# Patient Record
Sex: Female | Born: 1935 | Race: White | Hispanic: No | Smoking: Never smoker
Health system: Southern US, Community
[De-identification: ages and names within clinical notes are randomized; demographics above are authoritative.]

## PROBLEM LIST (undated history)

## (undated) DIAGNOSIS — R609 Edema, unspecified: Secondary | ICD-10-CM

## (undated) DIAGNOSIS — R631 Polydipsia: Secondary | ICD-10-CM

## (undated) DIAGNOSIS — F323 Major depressive disorder, single episode, severe with psychotic features: Secondary | ICD-10-CM

## (undated) DIAGNOSIS — E70319 Ocular albinism, unspecified: Secondary | ICD-10-CM

## (undated) DIAGNOSIS — L719 Rosacea, unspecified: Secondary | ICD-10-CM

## (undated) DIAGNOSIS — I10 Essential (primary) hypertension: Secondary | ICD-10-CM

## (undated) DIAGNOSIS — F0390 Unspecified dementia without behavioral disturbance: Secondary | ICD-10-CM

## (undated) DIAGNOSIS — E785 Hyperlipidemia, unspecified: Secondary | ICD-10-CM

## (undated) DIAGNOSIS — F419 Anxiety disorder, unspecified: Secondary | ICD-10-CM

---

## 2003-12-12 ENCOUNTER — Other Ambulatory Visit: Payer: Self-pay

## 2005-03-06 ENCOUNTER — Emergency Department: Payer: Self-pay | Admitting: Internal Medicine

## 2006-06-16 ENCOUNTER — Ambulatory Visit: Payer: Self-pay | Admitting: Family Medicine

## 2006-07-26 ENCOUNTER — Ambulatory Visit: Payer: Self-pay | Admitting: Family Medicine

## 2007-07-10 ENCOUNTER — Ambulatory Visit: Payer: Self-pay | Admitting: Gastroenterology

## 2007-08-01 ENCOUNTER — Ambulatory Visit: Payer: Self-pay | Admitting: Family Medicine

## 2007-09-15 ENCOUNTER — Emergency Department: Payer: Self-pay | Admitting: Unknown Physician Specialty

## 2007-09-15 ENCOUNTER — Other Ambulatory Visit: Payer: Self-pay

## 2008-08-13 ENCOUNTER — Ambulatory Visit: Payer: Self-pay | Admitting: Family Medicine

## 2008-12-03 ENCOUNTER — Ambulatory Visit: Payer: Self-pay

## 2009-08-28 ENCOUNTER — Ambulatory Visit: Payer: Self-pay | Admitting: Family Medicine

## 2011-05-10 ENCOUNTER — Inpatient Hospital Stay: Payer: Self-pay | Admitting: Internal Medicine

## 2012-01-14 LAB — URINALYSIS, COMPLETE
Bilirubin,UR: NEGATIVE
Glucose,UR: NEGATIVE mg/dL (ref 0–75)
Hyaline Cast: 10
Ketone: NEGATIVE
Nitrite: NEGATIVE
Ph: 7 (ref 4.5–8.0)
RBC,UR: 14 /HPF (ref 0–5)
Squamous Epithelial: 1
WBC UR: 10 /HPF (ref 0–5)

## 2012-01-14 LAB — BASIC METABOLIC PANEL
Calcium, Total: 9.6 mg/dL (ref 8.5–10.1)
Chloride: 104 mmol/L (ref 98–107)
Co2: 27 mmol/L (ref 21–32)
Creatinine: 1.1 mg/dL (ref 0.60–1.30)
EGFR (Non-African Amer.): 51 — ABNORMAL LOW
Glucose: 142 mg/dL — ABNORMAL HIGH (ref 65–99)
Osmolality: 289 (ref 275–301)
Potassium: 3.5 mmol/L (ref 3.5–5.1)
Sodium: 144 mmol/L (ref 136–145)

## 2012-01-14 LAB — CBC
HCT: 48.1 % — ABNORMAL HIGH (ref 35.0–47.0)
HGB: 16.1 g/dL — ABNORMAL HIGH (ref 12.0–16.0)
MCH: 31.8 pg (ref 26.0–34.0)
MCHC: 33.5 g/dL (ref 32.0–36.0)
RDW: 13.5 % (ref 11.5–14.5)

## 2012-01-14 LAB — TROPONIN I: Troponin-I: 0.06 ng/mL — ABNORMAL HIGH

## 2012-01-15 LAB — TROPONIN I: Troponin-I: 0.14 ng/mL — ABNORMAL HIGH

## 2012-01-16 ENCOUNTER — Inpatient Hospital Stay: Payer: Self-pay | Admitting: Internal Medicine

## 2012-01-16 DIAGNOSIS — I498 Other specified cardiac arrhythmias: Secondary | ICD-10-CM

## 2012-01-16 LAB — URINE CULTURE

## 2012-01-18 LAB — MAGNESIUM: Magnesium: 1.9 mg/dL

## 2012-01-18 LAB — BASIC METABOLIC PANEL
Anion Gap: 11 (ref 7–16)
BUN: 4 mg/dL — ABNORMAL LOW (ref 7–18)
Calcium, Total: 8.7 mg/dL (ref 8.5–10.1)
Chloride: 109 mmol/L — ABNORMAL HIGH (ref 98–107)
Co2: 26 mmol/L (ref 21–32)
Creatinine: 0.74 mg/dL (ref 0.60–1.30)
EGFR (African American): >60
Osmolality: 288 (ref 275–301)

## 2012-01-18 LAB — CBC WITH DIFFERENTIAL/PLATELET
Basophil %: 0.5 %
Eosinophil #: 0.1 10*3/uL (ref 0.0–0.7)
Eosinophil %: 1.4 %
HCT: 39 % (ref 35.0–47.0)
MCH: 31.3 pg (ref 26.0–34.0)
MCHC: 33.3 g/dL (ref 32.0–36.0)
MCV: 94 fL (ref 80–100)
Monocyte #: 0.4 10*3/uL (ref 0.0–0.7)
Monocyte %: 7.4 %
Neutrophil #: 3.1 10*3/uL (ref 1.4–6.5)
Neutrophil %: 58 %
Platelet: 219 10*3/uL (ref 150–440)
RBC: 4.15 10*6/uL (ref 3.80–5.20)
RDW: 13.2 % (ref 11.5–14.5)
WBC: 5.4 10*3/uL (ref 3.6–11.0)

## 2012-01-19 LAB — BASIC METABOLIC PANEL
Anion Gap: 10 (ref 7–16)
BUN: 5 mg/dL — ABNORMAL LOW (ref 7–18)
Calcium, Total: 8.9 mg/dL (ref 8.5–10.1)
Chloride: 109 mmol/L — ABNORMAL HIGH (ref 98–107)
Co2: 28 mmol/L (ref 21–32)
Creatinine: 0.79 mg/dL (ref 0.60–1.30)
Sodium: 147 mmol/L — ABNORMAL HIGH (ref 136–145)

## 2014-10-09 ENCOUNTER — Emergency Department: Payer: Self-pay | Admitting: Emergency Medicine

## 2015-01-17 ENCOUNTER — Emergency Department: Payer: Self-pay | Admitting: Emergency Medicine

## 2015-04-08 ENCOUNTER — Ambulatory Visit
Admit: 2015-04-08 | Disposition: A | Discharge status: Home / Self Care | Payer: Self-pay | Attending: Ophthalmology | Admitting: Ophthalmology

## 2015-04-13 NOTE — Discharge Summary (Signed)
PATIENT NAME:  Jeanette MedinaHARRIS, Jeanette T MR#:  [id number] DATE OF BIRTH:  06-16-1929  DATE OF ADMISSION:  01/16/2012 DATE OF DISCHARGE:  01/19/2012  PRIMARY CARE PHYSICIAN: Dr. Hillery AldoSarah Patel REFERRING PHYSICIAN:  Dr. Shaune PollackLord ADMITTING PHYSICIAN: Dr. [name] DISCHARGING PHYSICIAN:  Dr. Lafayette DragonFirozvi   ADDENDUM TO DISCHARGE SUMMARY:  DISCHARGE DIAGNOSES:  1. Altered mental status secondary to urinary tract infection.  2. Elevated troponin not likely due to acute cardiac ischemia. Most likely demand ischemia. 3. Dementia.  4. Hypertension 5. Hyperlipidemia. 6. Hyperglycemia.  7. Low potassium.  8. Hypernatremia.  9. Altered mental status secondary to dementia with encephalopathy from hypernatremia.  HOSPITAL COURSE:  The patient had hypernatremia from dehydration from urinary tract infection, which probably worsened her mental status. She has baseline dementia that probably worsened, causing metabolic encephalopathy from her dehydration.  Thank you for allowing me to participate in the care of this patient.   CODE STATUS:  FULL CODE.        TOTAL ADDITIONAL TIME SPENT: 10 minutes.  ____________________________ Corie ChiquitoAmir A. Lafayette DragonFirozvi, MD aaf:bjt D: 01/20/2012 12:17:26 ET T: 01/20/2012 13:58:01 ET JOB#: [id number]  cc: Karolee OhsAmir A. Lafayette DragonFirozvi, MD, <Dictator> Sarah "Sallie" Allena KatzPatel, MD Coralyn PearAMIR A Mumtaz Lovins MD ELECTRONICALLY SIGNED 01/21/2012 12:01

## 2015-04-13 NOTE — Discharge Summary (Signed)
PATIENT NAME:  Jeanette Juarez, Jeanette Juarez MR#:  [id number] DATE OF BIRTH:  16-Apr-1929  DATE OF ADMISSION:  01/16/2012 DATE OF DISCHARGE:  01/19/2012  PRIMARY CARE PHYSICIAN: Dr. Hillery AldoSarah Patel  REFERRING PHYSICIAN: Dr. Governor Rooksebecca Lord  ADMITTING PHYSICIAN: Dr. [name]  DISCHARGE PHYSICIAN: Dr. Larena GlassmanAmir Daisha Filosa    ADMITTING DIAGNOSIS: Off balance, altered mental status.   DISCHARGE DIAGNOSES:  1. Altered mental status secondary to urinary tract infection.  2. Elevated troponin not likely due to acute cardiac ischemia, most likely demand ischemia.  3. Dementia.  4. Hypertension.  5. Hyperlipidemia.  6. Hyperglycemia.  7. Low potassium.   CONSULTANTS:  1. Case management. 2. Physical therapy. 3. Occupational therapy.  4. Dr. [name] of cardiology.  LABORATORY, DIAGNOSTIC AND RADIOLOGICAL DATA: Head CT 01/14/2012 showed no acute abnormality. Chest x-ray 01/17/2012 showed no acute abnormalities, degenerative changes noted in thoracic spine. Echo which is still pending.   HOSPITAL COURSE: Initial history and physical were done by Dr. [name]. Please refer to his note dated 01/14/2012 for complete details. In brief, this is an 81 year old white female with past medical history of dementia, hypertension, hyperlipidemia, depression, osteoporosis who presents with altered mental status. She was noted to have urinary tract infection, started on Rocephin. Patient was admitted for altered mental status to the hospitalist service.  1. Altered mental status, most likely from urinary tract infection, dehydration. Patient's chest x-ray showed no significant abnormalities. Head CT showed no abnormalities. She improved back to her baseline. TSH was checked. She was evaluated by PT and recommended discharge back to assisted living.  2. Urinary tract infection. Patient received IV ceftriaxone for five days and will be switched to Ceftin for four more days.  3. Elevated troponin. This was slightly up, most likely not due to  acute cardiac ischemia. She was continued on aspirin and statin. She was unable to tolerate beta blocker due to her heart rate. She had echo done by Dr. [name] and seen by him. Echo results are still pending.  4. Dementia. Patient was continued on Zyprexa, Aricept.  5. Hypertension. Patient was well controlled on Norvasc.  6. Hyperlipidemia. Patient was continued on atorvastatin.  7. Hyperglycemia. A1c was checked and found to 5.8.  8. Low potassium. Magnesium was 1.9. This is chronic. She was repleted. 9. DVT prophylaxis maintained with Lovenox.  10. Dehydration was from urinary tract infection and elevated sodium. Patient's Lasix was held. Will resume now that she is taking adequate p.o. and her urinary tract infection has been asymptomatic now.   PHYSICAL EXAMINATION: VITAL SIGNS: Patient was discharged on 01/19/2012. Temperature 98.4, heart rate 50, respiratory rate 20, blood pressure 124/67, sating 94% on room air. GENERAL: Patient is well developed. LUNGS: Clear to auscultation. CARDIOVASCULAR: Regular rate and rhythm. ABDOMEN: Benign.   DISCHARGE MEDICATIONS: Patient will be discharged on the following medications:  1. Calcium and vitamin D 315/200 international units 2 tablets b.i.d.  2. Q-Pap 325, 1 to 2 tablets q.4-6 hours p.r.n. pain. 3. Therafirm 15 to 20 mmHg use once a day. 4. Loratadine 10 mg as needed for congestion, sneezing. 5. Alendronate 70 mg weekly. 6. Amlodipine 10 mg daily.  7. Atorvastatin 20 mg daily.  8. Baza-Protect topical cream apply sacral area once a day.  9. Certa-Vite with antioxidants 1 tablet once a day at 8:00 a.m.  10. Donepezil 10 mg at bedtime.  11. Lasix 20 mg daily.  12. Olanzapine 5 mg at bedtime.  13. Polyethylene glycol 3350 oral powder 1 capful in 8  ounces at night.  14. Aspirin 325 mg daily. 15. Potassium 10 mEq p.o. b.i.d.  16. Ceftin 250 mg p.o. b.i.d. x4 days.   OXYGEN: None.   ACTIVITY: As tolerated with assistance, physical therapy,    DIET: Low-sodium diet or low salt diet.  FOLLOW UP: Patient should follow up with Dr. [name] in one week.  CODE STATUS: FULL CODE.  I discussed with patient's DSS worker, [name]. She is agreeable. Patient will go back.   Thank you for allowing me to participate in the care of this patient.   Total Time: 40 minutes  ____________________________ [name]. [name], MD aaf:cms D: 01/19/2012 13:47:46 ET Juarez: 01/19/2012 14:10:00 ET JOB#: [id number]  cc: Karolee Ohs A. [name], MD, <Dictator> [name], MD [name] MD ELECTRONICALLY SIGNED 01/19/2012 15:31

## 2015-04-13 NOTE — Consult Note (Signed)
Chief Complaint:   Subjective/Chief Complaint More alert today. Denies pain over fever.   VITAL SIGNS/ANCILLARY NOTES: **Vital Signs.:   27-Jan-13 13:06   Vital Signs Type Routine   Temperature Temperature (F) 97.9   Celsius 36.6   Temperature Source oral   Pulse Pulse 57   Pulse source per Dinamap   Respirations Respirations 18   Systolic BP Systolic BP 109   Diastolic BP (mmHg) Diastolic BP (mmHg) 57   Mean BP 74   BP Source Dinamap   Pulse Ox % Pulse Ox % 98   Pulse Ox Activity Level  At rest   Oxygen Delivery Room Air/ 21 %  *Intake and Output.:   27-Jan-13 15:33   Grand Totals Intake:  461 Output:      Net:  461 24 Hr.:  461   IV (Primary)      In:  461   Brief Assessment:   Cardiac Regular  -- carotid bruits  -- LE edema  -- JVD  --Gallop    Respiratory normal resp effort  clear BS  rhonchi    Gastrointestinal Normal    Gastrointestinal details normal Soft  Nontender  Nondistended  No masses palpable  Bowel sounds normal   Cardiac:  27-Jan-13 04:43    Troponin I 0.10   Radiology Results: CT:    25-Jan-13 11:10, CT Head Without Contrast   CT Head Without Contrast    REASON FOR EXAM:    OFF BALANCE  COMMENTS:       PROCEDURE: CT  - CT HEAD WITHOUT CONTRAST  - Jan 14 2012 11:10AM     RESULT: History: Off-balance. Altered mental status.    Comparison Study: No prior.    Findings: No mass.  No hemorrhage. Diffuse cerebral and cerebellar   atrophy is present. Ventriculomegaly noted consistent degree of atrophy.   White matter changes consistent with chronic ischemia. Prominent   thickening noted about the ethmoidal sinuses and maxillary sinuses.   Vascular calcification noted.    IMPRESSION:  Sinusitis. No acute abnormality. Diffuse cerebral and   cerebellar atrophy is present , white matter changes consistent with   chronic ischemia present.          Verified By: Gwynn BurlyHOMAS E. REGISTER, M.D., MD   Assessment/Plan:  Invasive Device Daily Assessment  of Necessity:   Does the patient currently have any of the following indwelling devices? none   Assessment/Plan:   Assessment IMP AMS UTI Dementia HTN Hyperlipidemia Depression Elevated Troponin    Plan PLAN IV antibx F/U troponins F/U EKGs Bp meds for control Continue statins ASA po 81 mg ECHO for elevated troponin I do not rec invasive cardiac w/u   Electronic Signatures: Dorothyann Pengallwood, Dwayne D (MD)  (Signed 27-Jan-13 22:39)  Authored: Chief Complaint, VITAL SIGNS/ANCILLARY NOTES, Brief Assessment, Lab Results, Radiology Results, Assessment/Plan   Last Updated: 27-Jan-13 22:39 by Dorothyann Pengallwood, Dwayne D (MD)

## 2015-04-13 NOTE — Consult Note (Signed)
PATIENT NAME:  Jeanette Juarez, Jeanette Juarez MR#:  [id number] DATE OF BIRTH:  1928-12-22  DATE OF CONSULTATION:  01/15/2012  REFERRING PHYSICIAN:  [facility]  CONSULTING PHYSICIAN:  Marlon Suleiman D. [name], MD  PRIMARY CARE PHYSICIAN:  Maryruth HancockSallie Patel, MD  INDICATION: Altered mental status, vertigo.   HISTORY OF PRESENT ILLNESS: [name] is an 81 year old white female with advanced dementia, hypertension, and hyperlipidemia, depression, and osteoporosis sent here with the above chief complaint. The patient comes from the nursing home.  She was found to be off balance and falling to the right side after breakfast. In addition the patient looked confused.  The patient has advanced dementia and is unable to help with the history. She is awake, but does not communicate. It is noted that she possibly had a urinary tract infection, was treated with Rocephin and advised to be admitted for altered mental status.   REVIEW OF SYSTEMS: No blackout spells. No syncope. Denies nausea or vomiting. No fever. No chills or sweats, but her mental status has been compromised. She has dementia and unable to give much history.   PAST MEDICAL HISTORY:  1. Advanced dementia.  2. Hypertension.  3. Osteoporosis.  4. Hyperlipidemia.  5. Depression.   SOCIAL HISTORY: Nursing home. No smoking or alcohol consumption.   FAMILY HISTORY: Unobtainable.   ALLERGIES: Codeine.   MEDICATIONS:  1. Alendronate 70 mg a week.  2. Amlodipine once a day.  3. Aspirin 81 mg a day.  4. Lipitor 20 mg a day.  5. Calcium and vitamin D twice a day.  6. Vitamin, antioxidant once a day.  7. Donepezil 10 mg a day.  8. Lasix 20 mg a day.  9. Loratadine 10 mg p.r.n.  10. Olanzapine 5 mg at bedtime.  11. Polyethylene glycol 3350 oral powder once a day.  12. Potassium 10 mEq a day.   PHYSICAL EXAMINATION:  VITAL SIGNS: Blood pressure 100/50, pulse 75, respiratory rate 16, afebrile.   HEENT: Normocephalic, atraumatic. Pupils equal, reactive to light.    NECK: Supple. No jugular venous distention, bruit, or adenopathy.   LUNGS:  Clear to auscultation and percussion. No wheezing, rhonchi, or rales.  HEART:  Regular rhythm.  No significant murmur, gallops, or rubs.   ABDOMEN: Benign.   EXTREMITIES: Within normal limits.  NEURO:  Intact, but difficult to assess because of dementia.   PSYCHIATRIC:  She is alert but not oriented.  LABORATORY, DIAGNOSTIC AND RADIOLOGICAL DATA: CT of the head: Diffuse atrophy. White count 6.3, hemoglobin 16, platelets 166, glucose 142, BUN 11, creatinine 1.1, potassium 3.45, chloride 104, bicarbonate 27. Urinalysis was essentially negative for nitrite, 14 RBCs, 10 WBCs, troponin 0.06.   EKG: Normal sinus rhythm, rate of 78.   ASSESSMENT:  Altered mental status, urinary tract infection, dementia, hypertension, hyperlipidemia.    PLAN: Agree with admit. Place on medical floor. Continue medications for urinary tract infection with antibiotics and fluid resuscitation.  Aspirin as necessary. Watch for falls and fall precautions. Consider aspiration precautions. Continue current medications for hypertension and dementia. No clear evidence of unstable angina or coronary artery disease. She may have presyncope but it is not clear.  EKG appears to be normal.  Would consider telemetry. Deep venous thrombosis prophylaxis. Would consider conservative medical therapy because of her comorbid medical status.    ____________________________ Bobbie Stackwayne D. [name], MD ddc:bjt D: 02/04/2012 08:38:56 ET Juarez: 02/04/2012 11:10:33 ET JOB#: [id number]  cc: Jeanie Mccard D. [name], MD, <Dictator> Alwyn PeaWAYNE D Mabeline Varas MD ELECTRONICALLY SIGNED 02/08/2012 10:36

## 2015-04-13 NOTE — H&P (Signed)
PATIENT NAME:  Jeanette Juarez, Jeanette Juarez MR#:  [id number] DATE OF BIRTH:  1929/03/19  DATE OF ADMISSION:  01/14/2012  PRIMARY CARE PHYSICIAN: Sarah "Sallie" Allena KatzPatel, MD   REFERRING PHYSICIAN:   Governor Rooksebecca Lord, MD   CHIEF COMPLAINT: Off balance today.   HISTORY OF PRESENT ILLNESS: The patient is an 81 year old Caucasian female with advanced dementia, hypertension, hyperlipidemia, depression, osteoporosis, sent to the ED with the above chief complaint. The patient comes from a nursing home. She was found off balance and falling to the right side after breakfast. In addition, the patient looked confused. The patient has advanced dementia, cannot provide any information. She is awake but does not communicate. She was noted to have a urinary tract infection and was treated with Rocephin, and Dr. Shaune PollackLord admitted the patient for altered mental status and urinary tract infection.   PAST MEDICAL HISTORY:  1. Advanced dementia.  2. Hypertension.  3. Osteoporosis.  4. Hyperlipidemia.  5. Depression.   SOCIAL HISTORY: She is living in a nursing home. No history of tobacco abuse, alcohol use or drug abuse.   ALLERGIES: Codeine.   MEDICATIONS:  1. Alendronate 70 mg p.o. once a week at 11:00 a.m. on an empty stomach.  2. Amlodipine 1 tab once daily.  3. Aspirin 81 mg once daily.  4. Atorvastatin 20 mg p.o. at bedtime.  5. Baza-Protect topical cream to sacral area topical p.r.n.  6. Calcium with vitamin D 350 mg/200 units p.o. 2 tablets b.i.d.  7. CertaVite with antioxidants, oral tablet, 1 tablet daily.  8. Donepezil 10 mg p.o. once daily.  9. Lasix 20 mg p.o. daily.  10. Loratadine 10 mg p.o. p.r.n. for congestion or sneezing. 11. Olanzapine 5 mg p.o. at bedtime.  12. Polyethylene glycol 3350 oral powder once daily.  13. Potassium 10 mEq tablet, 1 tablet p.o. at bedtime. 14. Q-Pap 325 mg, 1 to 2 tablets p.o. every 4 to 6 hours p.r.n. for pain.  15. Therafirm 15/20 mmHg-med once a day for swelling foot.    REVIEW OF SYSTEMS:  The patient has advanced dementia, and I am unable to obtain review of systems.  PHYSICAL EXAMINATION:  VITALS: Temperature 98, pulse 73, respirations 18, blood pressure 102/47, oxygen saturation 96%.   GENERAL: The patient is weak but demented, in no acute distress.   HEENT: Pupils are round pinpoint, about 1 mm in diameter. No reaction to light. No discharge from ear or nose. Dry oral mucosa.   NECK: Supple. No JVD, no carotid bruits, no lymphadenopathy, no thyromegaly.   CARDIOVASCULAR: S1, S2, regular rate and rhythm. No murmurs or gallops.   PULMONARY: Bilateral air entry, no wheezing or rales.   ABDOMEN: Soft. No distention. No tenderness. No organomegaly. Bowel sounds are present.   EXTREMITIES: No edema, clubbing, or cyanosis. No calf tenderness, +1 pedal pulses.   NEUROLOGICAL: Unable to examine due to patient's demented status, but deep tendon reflexes are 2+.   SKIN: No rash or jaundice.     LABORATORY, DIAGNOSTIC AND RADIOLOGICAL DATA:  CAT scan of head: No acute abnormality. Diffuse cerebral and cerebellar chalky white matter changes consistent with chronic ischemia present.  WBC 6.3, hemoglobin 16.1, platelets 166.  Glucose 142, BUN 11, creatinine 1.1, potassium 3.45, chloride 104, bicarbonate 27.  Urinalysis: Nitrate negative, RBC 14, WBC 10.  Troponin level 0.06. EKG showed normal sinus rhythm at 78 beats per minute.   IMPRESSION:  1. Urinary tract infection.  2. Altered mental status.  3. Advanced dementia.   4.  Hypertension, controlled.  5. Hyperlipidemia.   PLAN OF TREATMENT:  1. The patient will be admitted to a Medical floor.  2. We will continue Rocephin IVPB and start IV fluid support.  3. We will start fall and aspiration precautions.  4. Continue the patient's home medication for hypertension and dementia  5. GI and deep vein thrombosis prophylaxis.   TIME SPENT: About 50 minutes.   ____________________________ Shaune Pollack, MD qc:cbb D: 01/14/2012 15:09:50 ET Juarez: 01/14/2012 15:51:56 ET JOB#: [id number]  cc: Shaune Pollack, MD, <Dictator> [name], MD Shaune Pollack MD ELECTRONICALLY SIGNED 01/14/2012 19:02

## 2016-02-03 ENCOUNTER — Emergency Department: Payer: Medicare Other

## 2016-02-03 ENCOUNTER — Encounter: Payer: Self-pay | Admitting: Intensive Care

## 2016-02-03 ENCOUNTER — Inpatient Hospital Stay: Payer: Medicare Other

## 2016-02-03 ENCOUNTER — Inpatient Hospital Stay
Admission: EM | Admit: 2016-02-03 | Discharge: 2016-02-07 | DRG: 689 | Disposition: A | Discharge status: Hospice | Payer: Medicare Other | Attending: Internal Medicine | Admitting: Internal Medicine

## 2016-02-03 DIAGNOSIS — Z681 Body mass index (BMI) 19 or less, adult: Secondary | ICD-10-CM

## 2016-02-03 DIAGNOSIS — Z66 Do not resuscitate: Secondary | ICD-10-CM | POA: Diagnosis present

## 2016-02-03 DIAGNOSIS — E860 Dehydration: Secondary | ICD-10-CM | POA: Diagnosis present

## 2016-02-03 DIAGNOSIS — Z79899 Other long term (current) drug therapy: Secondary | ICD-10-CM

## 2016-02-03 DIAGNOSIS — F419 Anxiety disorder, unspecified: Secondary | ICD-10-CM | POA: Diagnosis present

## 2016-02-03 DIAGNOSIS — Z7982 Long term (current) use of aspirin: Secondary | ICD-10-CM | POA: Diagnosis not present

## 2016-02-03 DIAGNOSIS — T148XXA Other injury of unspecified body region, initial encounter: Secondary | ICD-10-CM

## 2016-02-03 DIAGNOSIS — Z7983 Long term (current) use of bisphosphonates: Secondary | ICD-10-CM

## 2016-02-03 DIAGNOSIS — Z515 Encounter for palliative care: Secondary | ICD-10-CM | POA: Diagnosis present

## 2016-02-03 DIAGNOSIS — Z79891 Long term (current) use of opiate analgesic: Secondary | ICD-10-CM

## 2016-02-03 DIAGNOSIS — W19XXXA Unspecified fall, initial encounter: Secondary | ICD-10-CM | POA: Diagnosis present

## 2016-02-03 DIAGNOSIS — F0390 Unspecified dementia without behavioral disturbance: Secondary | ICD-10-CM | POA: Diagnosis present

## 2016-02-03 DIAGNOSIS — E785 Hyperlipidemia, unspecified: Secondary | ICD-10-CM | POA: Diagnosis present

## 2016-02-03 DIAGNOSIS — Z9181 History of falling: Secondary | ICD-10-CM | POA: Diagnosis not present

## 2016-02-03 DIAGNOSIS — I10 Essential (primary) hypertension: Secondary | ICD-10-CM | POA: Diagnosis present

## 2016-02-03 DIAGNOSIS — R4182 Altered mental status, unspecified: Secondary | ICD-10-CM

## 2016-02-03 DIAGNOSIS — E70319 Ocular albinism, unspecified: Secondary | ICD-10-CM | POA: Diagnosis present

## 2016-02-03 DIAGNOSIS — S43004S Unspecified dislocation of right shoulder joint, sequela: Secondary | ICD-10-CM | POA: Diagnosis not present

## 2016-02-03 DIAGNOSIS — F0280 Dementia in other diseases classified elsewhere without behavioral disturbance: Secondary | ICD-10-CM | POA: Diagnosis present

## 2016-02-03 DIAGNOSIS — G309 Alzheimer's disease, unspecified: Secondary | ICD-10-CM | POA: Diagnosis present

## 2016-02-03 DIAGNOSIS — S43004A Unspecified dislocation of right shoulder joint, initial encounter: Secondary | ICD-10-CM | POA: Diagnosis present

## 2016-02-03 DIAGNOSIS — E43 Unspecified severe protein-calorie malnutrition: Secondary | ICD-10-CM | POA: Diagnosis present

## 2016-02-03 DIAGNOSIS — E870 Hyperosmolality and hypernatremia: Secondary | ICD-10-CM | POA: Diagnosis present

## 2016-02-03 DIAGNOSIS — B9620 Unspecified Escherichia coli [E. coli] as the cause of diseases classified elsewhere: Secondary | ICD-10-CM | POA: Diagnosis present

## 2016-02-03 DIAGNOSIS — N390 Urinary tract infection, site not specified: Principal | ICD-10-CM | POA: Diagnosis present

## 2016-02-03 DIAGNOSIS — G9341 Metabolic encephalopathy: Secondary | ICD-10-CM | POA: Diagnosis present

## 2016-02-03 HISTORY — DX: Unspecified dementia, unspecified severity, without behavioral disturbance, psychotic disturbance, mood disturbance, and anxiety: F03.90

## 2016-02-03 HISTORY — DX: Rosacea, unspecified: L71.9

## 2016-02-03 HISTORY — DX: Edema, unspecified: R60.9

## 2016-02-03 HISTORY — DX: Essential (primary) hypertension: I10

## 2016-02-03 HISTORY — DX: Polydipsia: R63.1

## 2016-02-03 HISTORY — DX: Hyperlipidemia, unspecified: E78.5

## 2016-02-03 HISTORY — DX: Ocular albinism, unspecified: E70.319

## 2016-02-03 HISTORY — DX: Anxiety disorder, unspecified: F41.9

## 2016-02-03 HISTORY — DX: Major depressive disorder, single episode, severe with psychotic features: F32.3

## 2016-02-03 LAB — COMPREHENSIVE METABOLIC PANEL
ALT: 42 U/L (ref 14–54)
AST: 85 U/L — ABNORMAL HIGH (ref 15–41)
Albumin: 3.6 g/dL (ref 3.5–5.0)
Alkaline Phosphatase: 83 U/L (ref 38–126)
Anion gap: 8 (ref 5–15)
BUN: 20 mg/dL (ref 6–20)
CO2: 31 mmol/L (ref 22–32)
Calcium: 9.8 mg/dL (ref 8.9–10.3)
Chloride: 111 mmol/L (ref 101–111)
Creatinine, Ser: 0.79 mg/dL (ref 0.44–1.00)
GFR calc Af Amer: >60 mL/min (ref >60)
GFR calc non Af Amer: >60 mL/min (ref >60)
Glucose, Bld: 131 mg/dL — ABNORMAL HIGH (ref 65–99)
Potassium: 4 mmol/L (ref 3.5–5.1)
Sodium: 150 mmol/L — ABNORMAL HIGH (ref 135–145)
Total Bilirubin: 1.1 mg/dL (ref 0.3–1.2)
Total Protein: 8.2 g/dL — ABNORMAL HIGH (ref 6.5–8.1)

## 2016-02-03 LAB — CBC WITH DIFFERENTIAL/PLATELET
Basophils Absolute: 0 10*3/uL (ref 0–0.1)
Basophils Relative: 0 %
EOS PCT: 0 %
Eosinophils Absolute: 0 10*3/uL (ref 0–0.7)
HCT: 51 % — ABNORMAL HIGH (ref 35.0–47.0)
Hemoglobin: 17.2 g/dL — ABNORMAL HIGH (ref 12.0–16.0)
LYMPHS ABS: 1.6 10*3/uL (ref 1.0–3.6)
LYMPHS PCT: 11 %
MCH: 31.1 pg (ref 26.0–34.0)
MCHC: 33.7 g/dL (ref 32.0–36.0)
MCV: 92.3 fL (ref 80.0–100.0)
MONOS PCT: 8 %
Monocytes Absolute: 1.1 10*3/uL — ABNORMAL HIGH (ref 0.2–0.9)
Neutro Abs: 11.4 10*3/uL — ABNORMAL HIGH (ref 1.4–6.5)
Neutrophils Relative %: 81 %
PLATELETS: 210 10*3/uL (ref 150–440)
RBC: 5.52 MIL/uL — AB (ref 3.80–5.20)
RDW: 14.1 % (ref 11.5–14.5)
WBC: 14.1 10*3/uL — AB (ref 3.6–11.0)

## 2016-02-03 LAB — URINALYSIS COMPLETE WITH MICROSCOPIC (ARMC ONLY)
Bilirubin Urine: NEGATIVE
Glucose, UA: NEGATIVE mg/dL
Ketones, ur: NEGATIVE mg/dL
Nitrite: POSITIVE — AB
Protein, ur: 30 mg/dL — AB
Specific Gravity, Urine: 1.015 (ref 1.005–1.030)
pH: 5 (ref 5.0–8.0)

## 2016-02-03 LAB — ACETAMINOPHEN LEVEL: Acetaminophen (Tylenol), Serum: <10 ug/mL — ABNORMAL LOW (ref 10–30)

## 2016-02-03 LAB — SALICYLATE LEVEL: Salicylate Lvl: <4 mg/dL (ref 2.8–30.0)

## 2016-02-03 LAB — MRSA PCR SCREENING: MRSA by PCR: POSITIVE — AB

## 2016-02-03 LAB — CK: CK TOTAL: 3920 U/L — AB (ref 38–234)

## 2016-02-03 LAB — TROPONIN I
TROPONIN I: 0.03 ng/mL (ref <0.031)
Troponin I: 0.06 ng/mL — ABNORMAL HIGH (ref <0.031)

## 2016-02-03 MED ORDER — ETOMIDATE 2 MG/ML IV SOLN
INTRAVENOUS | Status: AC | PRN
  Administered 2016-02-03: 4 mg via INTRAVENOUS

## 2016-02-03 MED ORDER — ALBUTEROL SULFATE (2.5 MG/3ML) 0.083% IN NEBU: 2.5000 mg | INHALATION_SOLUTION | RESPIRATORY_TRACT | Status: DC | PRN

## 2016-02-03 MED ORDER — BISACODYL 10 MG RE SUPP: 10.0000 mg | Freq: Every day | RECTAL | Status: DC | PRN

## 2016-02-03 MED ORDER — ONDANSETRON HCL 4 MG/2ML IJ SOLN: 4.0000 mg | Freq: Four times a day (QID) | INTRAMUSCULAR | Status: DC | PRN

## 2016-02-03 MED ORDER — MORPHINE SULFATE (PF) 2 MG/ML IV SOLN: 2.0000 mg | INTRAVENOUS | Status: DC | PRN

## 2016-02-03 MED ORDER — POLYETHYLENE GLYCOL 3350 17 G PO PACK: 17.0000 g | PACK | Freq: Every day | ORAL | Status: DC

## 2016-02-03 MED ORDER — SODIUM CHLORIDE 0.45 % IV SOLN
Freq: Once | INTRAVENOUS | Status: AC
Start: 2016-02-03 — End: 2016-02-03
  Administered 2016-02-03: 18:00:00 via INTRAVENOUS

## 2016-02-03 MED ORDER — ATORVASTATIN CALCIUM 20 MG PO TABS
20.0000 mg | ORAL_TABLET | Freq: Every day | ORAL | Status: DC
  Administered 2016-02-04: 20 mg via ORAL
  Filled 2016-02-03: qty 1

## 2016-02-03 MED ORDER — ASPIRIN EC 81 MG PO TBEC
81.0000 mg | DELAYED_RELEASE_TABLET | Freq: Every day | ORAL | Status: DC
Start: 2016-02-03 — End: 2016-02-06
  Filled 2016-02-03 (×2): qty 1

## 2016-02-03 MED ORDER — SODIUM CHLORIDE 0.9 % IV BOLUS (SEPSIS)
500.0000 mL | Freq: Once | INTRAVENOUS | Status: AC
  Administered 2016-02-03: 500 mL via INTRAVENOUS

## 2016-02-03 MED ORDER — SODIUM CHLORIDE 0.45 % IV SOLN
INTRAVENOUS | Status: DC
  Administered 2016-02-03 – 2016-02-06 (×5): via INTRAVENOUS

## 2016-02-03 MED ORDER — FENTANYL CITRATE (PF) 100 MCG/2ML IJ SOLN
50.0000 ug | Freq: Once | INTRAMUSCULAR | Status: AC
  Administered 2016-02-03: 50 ug via INTRAVENOUS
  Filled 2016-02-03: qty 2

## 2016-02-03 MED ORDER — ONDANSETRON HCL 4 MG PO TABS: 4.0000 mg | ORAL_TABLET | Freq: Four times a day (QID) | ORAL | Status: DC | PRN

## 2016-02-03 MED ORDER — DEXTROSE 5 % IV SOLN
1.0000 g | INTRAVENOUS | Status: DC
  Administered 2016-02-04 – 2016-02-06 (×3): 1 g via INTRAVENOUS
  Filled 2016-02-03 (×3): qty 10

## 2016-02-03 MED ORDER — POLYETHYLENE GLYCOL 3350 17 G PO PACK: 17.0000 g | PACK | Freq: Every day | ORAL | Status: DC | PRN

## 2016-02-03 MED ORDER — DOCUSATE SODIUM 100 MG PO CAPS
100.0000 mg | ORAL_CAPSULE | Freq: Two times a day (BID) | ORAL | Status: DC
  Administered 2016-02-04: 21:00:00 100 mg via ORAL
  Filled 2016-02-03 (×3): qty 1

## 2016-02-03 MED ORDER — DEXTROSE 5 % IV SOLN
1.0000 g | Freq: Once | INTRAVENOUS | Status: AC
  Administered 2016-02-03: 1 g via INTRAVENOUS
  Filled 2016-02-03: qty 10

## 2016-02-03 MED ORDER — OXYCODONE HCL 5 MG PO TABS: 5.0000 mg | ORAL_TABLET | ORAL | Status: DC | PRN

## 2016-02-03 MED ORDER — ENOXAPARIN SODIUM 40 MG/0.4ML ~~LOC~~ SOLN
40.0000 mg | SUBCUTANEOUS | Status: DC
  Administered 2016-02-03: 20:00:00 40 mg via SUBCUTANEOUS
  Filled 2016-02-03: qty 0.4

## 2016-02-03 MED ORDER — ETOMIDATE 2 MG/ML IV SOLN
INTRAVENOUS | Status: AC
  Administered 2016-02-03: 16:00:00
  Filled 2016-02-03: qty 20

## 2016-02-03 MED ORDER — ACETAMINOPHEN 325 MG PO TABS: 650.0000 mg | ORAL_TABLET | Freq: Four times a day (QID) | ORAL | Status: DC | PRN

## 2016-02-03 MED ORDER — SODIUM CHLORIDE 0.9% FLUSH
3.0000 mL | Freq: Two times a day (BID) | INTRAVENOUS | Status: DC
Start: 2016-02-03 — End: 2016-02-06
  Administered 2016-02-03 – 2016-02-06 (×5): 3 mL via INTRAVENOUS

## 2016-02-03 MED ORDER — LORATADINE 10 MG PO TABS
10.0000 mg | ORAL_TABLET | Freq: Every day | ORAL | Status: DC
  Filled 2016-02-03 (×2): qty 1

## 2016-02-03 MED ORDER — ETOMIDATE 2 MG/ML IV SOLN
4.0000 mg | Freq: Once | INTRAVENOUS | Status: AC
  Administered 2016-02-03: 4 mg via INTRAVENOUS

## 2016-02-03 MED ORDER — ACETAMINOPHEN 650 MG RE SUPP: 650.0000 mg | Freq: Four times a day (QID) | RECTAL | Status: DC | PRN

## 2016-02-03 MED ORDER — DONEPEZIL HCL 5 MG PO TABS
10.0000 mg | ORAL_TABLET | Freq: Every day | ORAL | Status: DC
  Administered 2016-02-04: 21:00:00 10 mg via ORAL
  Filled 2016-02-03: qty 2

## 2016-02-03 NOTE — ED Provider Notes (Addendum)
[facility] Emergency Department Provider Note  ____________________________________________   I have reviewed the triage vital signs and the nursing notes.   HISTORY  Chief Complaint Altered Mental Status    HPI Jeanette Juarez is a 82 y.o. female who presents from a nursing home. She does have a history of generalized weakness but apparently can sometimes bear weight and change position. The story from the nursing home seems to be somewhat variable. They initially stated there was no fall apparently she told me on the phone however that the patient was found at the base of the bed where she "slid out". Patient was found with decreased alertness today and theycalled 911 for these reasons. Patient herself cannot give a history. Family is not at bedside. The patient is at baseline nonverbal according to nursing home. She also has a baseline problem on the left. Unclear if there is any focal neurologic deficit. The report I got so there was not although there was some question as to whether there was for the triage nurse.  Past Medical History  Diagnosis Date  . Hypertension   . Anxiety   . Severe major depression with psychotic features (HCC)   . Dementia   . Hyperlipidemia   . Edema   . Rosacea   . Polydipsia   . OA (ocular albinism) (HCC)     There are no active problems to display for this patient.   History reviewed. No pertinent past surgical history.  Current Outpatient Rx  Name  Route  Sig  Dispense  Refill  . acetaminophen (TYLENOL) 325 MG tablet   Oral   Take 650 mg by mouth every 6 (six) hours as needed for mild pain.         Marland Kitchen acetaminophen (TYLENOL) 500 MG tablet   Oral   Take 500 mg by mouth every 6 (six) hours as needed for fever (FOR FEVER GREATER THAN 99.5).         Marland Kitchen alendronate (FOSAMAX) 70 MG tablet   Oral   Take 70 mg by mouth once a week. Take with a full glass of water on an empty stomach.         Marland Kitchen amLODipine  (NORVASC) 10 MG tablet   Oral   Take 10 mg by mouth daily.         Marland Kitchen aspirin EC 81 MG tablet   Oral   Take 81 mg by mouth daily.         Marland Kitchen atorvastatin (LIPITOR) 20 MG tablet   Oral   Take 20 mg by mouth at bedtime.         . Calcium Citrate-Vitamin D (CITRACAL PETITES/VITAMIN D) 200-250 MG-UNIT TABS   Oral   Take 2 tablets by mouth 2 (two) times daily.         Marland Kitchen donepezil (ARICEPT) 10 MG tablet   Oral   Take 10 mg by mouth at bedtime.         Marland Kitchen loratadine (CLARITIN) 10 MG tablet   Oral   Take 10 mg by mouth daily.         . Multiple Vitamins-Minerals (THERATRUM COMPLETE PO)   Oral   Take 1 tablet by mouth daily.         . polyethylene glycol (MIRALAX / GLYCOLAX) packet   Oral   Take 17 g by mouth daily.         Cliffton Asters Petrolatum-Mineral Oil (ARTIFICIAL TEARS) OINT ophthalmic ointment  Both Eyes   Place 1 application into both eyes 2 (two) times daily.           Allergies Review of patient's allergies indicates no known allergies.  History reviewed. No pertinent family history.  Social History Social History  Substance Use Topics  . Smoking status: Unknown If Ever Smoked  . Smokeless tobacco: Never Used  . Alcohol Use: None    Review of Systems Cannot obtain secondary to patient mental status  ____________________________________________   PHYSICAL EXAM:  VITAL SIGNS: ED Triage Vitals  Enc Vitals Group     BP 02/03/16 1200 139/95 mmHg     Pulse Rate 02/03/16 1200 101     Resp 02/03/16 1200 20     Temp 02/03/16 1227 99.2 F (37.3 C)     Temp Source 02/03/16 1227 Axillary     SpO2 02/03/16 1227 97 %     Weight 02/03/16 1227 88 lb 12.8 oz (40.279 kg)     Height --      Head Cir --      Peak Flow --      Pain Score --      Pain Loc --      Pain Edu? --      Excl. in GC? --     Constitutional: Alert in no acute distress will not follow commands Eyes: Conjunctivae are normal. PERRL. EOMI. there is a eversion of the bottom  eyelid which appears chronic on the left Head: Atraumatic. Nose: No congestion/rhinnorhea. Mouth/Throat: Mucous membranes are dry.  Oropharynx non-erythematous. Neck: No stridor.   Nontender with no meningismus Cardiovascular: Normal rate, regular rhythm. Grossly normal heart sounds.  Good peripheral circulation. Respiratory: Normal respiratory effort.  No retractions. Lungs CTAB. Abdominal: Soft and nontender. No distention. No guarding no rebound Back:  There is no focal tenderness or step off there is no midline tenderness there are no lesions noted. there is no CVA tenderness Musculoskeletal: No lower extremity tenderness. No joint effusions, no DVT signs strong distal pulses no edema tender to palpation in the right shoulder with some questionable deformity, bruising is noted. Neurologic: Patient will not use her right arm but this may be secondary to pain, otherwise does not seem to be focal in her exam to the extent they can be determined without patient participation Skin:  Skin is warm, dry and intact. No rash noted.   ____________________________________________   LABS (all labs ordered are listed, but only abnormal results are displayed)  Labs Reviewed  COMPREHENSIVE METABOLIC PANEL - Abnormal; Notable for the following:    Sodium 150 (*)    Glucose, Bld 131 (*)    Total Protein 8.2 (*)    AST 85 (*)    All other components within normal limits  CBC WITH DIFFERENTIAL/PLATELET - Abnormal; Notable for the following:    WBC 14.1 (*)    RBC 5.52 (*)    Hemoglobin 17.2 (*)    HCT 51.0 (*)    Neutro Abs 11.4 (*)    Monocytes Absolute 1.1 (*)    All other components within normal limits  ACETAMINOPHEN LEVEL - Abnormal; Notable for the following:    Acetaminophen (Tylenol), Serum <10 (*)    All other components within normal limits  TROPONIN I - Abnormal; Notable for the following:    Troponin I 0.06 (*)    All other components within normal limits  URINALYSIS COMPLETEWITH  MICROSCOPIC ([facility] ONLY) - Abnormal; Notable for the following:    Color,  Urine YELLOW (*)    APPearance CLOUDY (*)    Hgb urine dipstick 2+ (*)    Protein, ur 30 (*)    Nitrite POSITIVE (*)    Leukocytes, UA 3+ (*)    Bacteria, UA MANY (*)    Squamous Epithelial / LPF 6-30 (*)    All other components within normal limits  URINE CULTURE  SALICYLATE LEVEL   ____________________________________________  EKG  I personally interpreted any EKGs ordered by me or triage Sinus tach rate 103 no acute ST elevation no acute ST depression normal axis ____________________________________________  RADIOLOGY  I reviewed any imaging ordered by me or triage that were performed during my shift ____________________________________________   PROCEDURES  Procedure(s) performed: Shoulder reduction closed Emergent procedure unable to obtain consent no family at bedside Time out was performed before procedure. Verified site verified procedure Using a very small dose of etomidate, 4 mg, patient was adequately sedated for the procedure. Then using a traction counter traction and scapular manipulation I was able to reduce the shoulder. No obvious complications remain neurovascularly intact. Awaiting x-ray verification. No desaturation or other events during moderate sedation  Moderate sedation: Using 4 mg of etomidate, I was able to achieve good sedation for this patient. She did not desaturate. I stay with the patient until she was waking up. Blood pressure and vital signs reassuring. End tidal CO2 showed no evidence of respiratory depression, pulse ox stayed at 100% on 2 L oxygen, blood pressure did not drop. Suction and bag valve mask were available throughout procedure. I was personally present throughout the procedure. See nurse's note for times.  Procedural sedation Performed by: Jeanmarie Plant Consent: Verbal consent obtained. Risks and benefits: risks, benefits and alternatives were  discussed Required items: required blood products, implants, devices, and special equipment available Patient identity confirmed: arm band and provided demographic data Time out: Immediately prior to procedure a "time out" was called to verify the correct patient, procedure, equipment, support staff and site/side marked as required.  Sedation type: moderate (conscious) sedation NPO time confirmed and considedered  Sedatives: ETOMIDATE  Physician Time at Bedside: 20 minutes  Vitals: Vital signs were monitored during sedation. Cardiac Monitor, pulse oximeter Patient tolerance: Patient tolerated the procedure well with no immediate complications. Comments: Pt with uneventful recovered. Returned to pre-procedural sedation baseline  Reduction of dislocation Date/Time: 4:28 PM Performed by: Jeanmarie Plant Authorized by: Jeanmarie Plant Consent: Verbal consent obtained. Risks and benefits: risks, benefits and alternatives were discussed Consent given by: patient Required items: required blood products, implants, devices, and special equipment available Time out: Immediately prior to procedure a "time out" was called to verify the correct patient, procedure, equipment, support staff and site/side marked as required.  Patient sedated: with etomidate   Vitals: Vital signs were monitored during sedation. Patient tolerance: Patient tolerated the procedure well with no immediate complications. Joint: shoulder, right  Reduction technique: traction counter traction with scapular manipulation.     Critical Care performed: None  ____________________________________________   INITIAL IMPRESSION / ASSESSMENT AND PLAN / ED COURSE  Pertinent labs & imaging results that were available during my care of the patient were reviewed by me and considered in my medical decision making (see chart for details).  Patient with altered mental status according to nursing home she is alert at this time.  Unfortunate she has multiple different issues we are addressing. First is a urinary tract infection for which are giving her IV antibiotics. The second is a  shoulder dislocation. I did try scapular manipulation and gradual reduction but the shoulder would not reduce easily without further sedation. I will talk to the power of attorney about whether they wish me to try this. Patient is DNR/DNI. There is significant bruising to the area. It is not clear if the mechanism of injury as described, even in the variable history we have received, is consistent with a shoulder dislocation. We will discuss with social work as well as I think patient will require admission.  ----------------------------------------- 3:13 PM on 02/03/2016 -----------------------------------------  I did try calling the nursing facility they stated they did not have the next of CAD or phone number on hand and they asked me to call another number which I did they said they did not have advanced medical another number which I did and that went to a voicemail without an answering machine attached to it. Therefore cannot contact next of kin. Nonetheless he do feel the patient requires an emergent reduction of her shoulder for comfort and we will proceed with all care. ____________________________________________   FINAL CLINICAL IMPRESSION(S) / ED DIAGNOSES  Final diagnoses:  Bruising      This chart was dictated using voice recognition software.  Despite best efforts to proofread,  errors can occur which can change meaning.     Jeanmarie Plant, MD 02/03/16 1508  Jeanmarie Plant, MD 02/03/16 1513  Jeanmarie Plant, MD 02/03/16 1617  Jeanmarie Plant, MD 02/03/16 [phone]

## 2016-02-03 NOTE — H&P (Signed)
[facility] [place] at [facility]   PATIENT NAME: Jeanette Juarez    MR#:  [id number]  DATE OF BIRTH:  20-Sep-1929  DATE OF ADMISSION:  02/03/2016  PRIMARY CARE PHYSICIAN: No PCP Per Patient   REQUESTING/REFERRING PHYSICIAN: Dr. Alphonzo Lemmings  CHIEF COMPLAINT:   Chief Complaint  Patient presents with  . Altered Mental Status    HISTORY OF PRESENT ILLNESS:  Jeanette Juarez  is a 82 y.o. female with a known history of hypertension, dementia, depression presents from nursing home due to fall and bruising on right shoulder. Patient has had decreased oral intake. Here patient is nonverbal and history obtained from emergency room staff and old records. Family not available. Attempts made to get numbers from nursing home and unavailable. Patient has been found to have a dislocated right shoulder along with UTI and hyper natremia. Mild elevation troponin of 0.06  PAST MEDICAL HISTORY:   Past Medical History  Diagnosis Date  . Hypertension   . Anxiety   . Severe major depression with psychotic features (HCC)   . Dementia   . Hyperlipidemia   . Edema   . Rosacea   . Polydipsia   . OA (ocular albinism) (HCC)     PAST SURGICAL HISTORY:  History reviewed. No pertinent past surgical history.  SOCIAL HISTORY:   Social History  Substance Use Topics  . Smoking status: Unknown If Ever Smoked  . Smokeless tobacco: Never Used  . Alcohol Use: Not on file    FAMILY HISTORY:  History reviewed. No pertinent family history. Unknown due to patients dementia DRUG ALLERGIES:  No Known Allergies  REVIEW OF SYSTEMS:   Review of Systems  Unable to perform ROS: dementia    MEDICATIONS AT HOME:   Prior to Admission medications   Medication Sig Start Date End Date Taking? Authorizing Provider  acetaminophen (TYLENOL) 325 MG tablet Take 650 mg by mouth every 6 (six) hours as needed for mild pain.   Yes Historical Provider, MD  acetaminophen (TYLENOL) 500 MG tablet Take  500 mg by mouth every 6 (six) hours as needed for fever (FOR FEVER GREATER THAN 99.5).   Yes Historical Provider, MD  alendronate (FOSAMAX) 70 MG tablet Take 70 mg by mouth once a week. Take with a full glass of water on an empty stomach.   Yes Historical Provider, MD  amLODipine (NORVASC) 10 MG tablet Take 10 mg by mouth daily.   Yes Historical Provider, MD  aspirin EC 81 MG tablet Take 81 mg by mouth daily.   Yes Historical Provider, MD  atorvastatin (LIPITOR) 20 MG tablet Take 20 mg by mouth at bedtime.   Yes Historical Provider, MD  Calcium Citrate-Vitamin D (CITRACAL PETITES/VITAMIN D) 200-250 MG-UNIT TABS Take 2 tablets by mouth 2 (two) times daily.   Yes Historical Provider, MD  donepezil (ARICEPT) 10 MG tablet Take 10 mg by mouth at bedtime.   Yes Historical Provider, MD  loratadine (CLARITIN) 10 MG tablet Take 10 mg by mouth daily.   Yes Historical Provider, MD  Multiple Vitamins-Minerals (THERATRUM COMPLETE PO) Take 1 tablet by mouth daily.   Yes Historical Provider, MD  polyethylene glycol (MIRALAX / GLYCOLAX) packet Take 17 g by mouth daily.   Yes Historical Provider, MD  White Petrolatum-Mineral Oil (ARTIFICIAL TEARS) OINT ophthalmic ointment Place 1 application into both eyes 2 (two) times daily.   Yes Historical Provider, MD      VITAL SIGNS:  Blood pressure 119/66, pulse 96, temperature 99.2 F (  37.3 C), temperature source Axillary, resp. rate 13, weight 40.279 kg (88 lb 12.8 oz), SpO2 96 %.  PHYSICAL EXAMINATION:  Physical Exam  GENERAL:  82 y.o.-year-old patient lying in the bed with no acute distress. Non verbal EYES: Pupils equal, round, reactive to light and accommodation. No scleral icterus. Extraocular muscles intact. Drooped left lower eyelid  HEENT: Head atraumatic, normocephalic. Oropharynx and nasopharynx clear. No oropharyngeal erythema, moist oral mucosa  NECK:  Supple, no jugular venous distention. No thyroid enlargement, no tenderness.  LUNGS: Normal breath  sounds bilaterally, no wheezing, rales, rhonchi. No use of accessory muscles of respiration.  CARDIOVASCULAR: S1, S2 normal. No murmurs, rubs, or gallops.  ABDOMEN: Soft, nontender, nondistended. Bowel sounds present. No organomegaly or mass.  EXTREMITIES: Dislocated right shoulder with bruising NEUROLOGIC: Cranial nerves II through XII are intact. No focal Motor or sensory deficits appreciated b/l PSYCHIATRIC: The patient is alert and awake. SKIN: No obvious rash, lesion, or ulcer.   LABORATORY PANEL:   CBC  Recent Labs Lab 02/03/16 1202  WBC 14.1*  HGB 17.2*  HCT 51.0*  PLT 210   ------------------------------------------------------------------------------------------------------------------  Chemistries   Recent Labs Lab 02/03/16 1202  NA 150*  K 4.0  CL 111  CO2 31  GLUCOSE 131*  BUN 20  CREATININE 0.79  CALCIUM 9.8  AST 85*  ALT 42  ALKPHOS 83  BILITOT 1.1   ------------------------------------------------------------------------------------------------------------------  Cardiac Enzymes  Recent Labs Lab 02/03/16 1202  TROPONINI 0.06*   ------------------------------------------------------------------------------------------------------------------  RADIOLOGY:  Dg Chest 1 View  02/03/2016  CLINICAL DATA:  Altered mental status. EXAM: CHEST 1 VIEW COMPARISON:  01/17/2012 FINDINGS: Hyperinflation of the lungs. Heart is upper limits normal in size. Lungs are clear. No effusions. There is anterior dislocation of the right humeral head, new since 2013, age indeterminate. IMPRESSION: No active cardiopulmonary disease.  Mild hyperinflation. Anterior right shoulder dislocation. Electronically Signed   By: Charlett Nose M.D.   On: 02/03/2016 12:30   Ct Head Wo Contrast  02/03/2016  CLINICAL DATA:  Right facial droop and bruise of the right upper arm since this morning. EXAM: CT HEAD WITHOUT CONTRAST TECHNIQUE: Contiguous axial images were obtained from the base of  the skull through the vertex without intravenous contrast. COMPARISON:  January 17, 2015 FINDINGS: There is chronic diffuse atrophy. Chronic bilateral periventricular white matter small vessel ischemic changes noted. Compensatory ventricular dilatation from cerebral atrophy is identified unchanged. There is no midline shift or mass. Small old lacune infarction is identified in the right caudate head unchanged. There is no acute hemorrhage or acute transcortical infarct. The bony calvarium is intact. The visualized sinuses are clear. IMPRESSION: No focal acute intracranial abnormality identified. Chronic diffuse atrophy. Chronic bilateral periventricular white matter small vessel ischemic change. Electronically Signed   By: Sherian Rein M.D.   On: 02/03/2016 12:48   Dg Humerus Right  02/03/2016  CLINICAL DATA:  Upper arm bruising and likely recent fall although a poor historian EXAM: RIGHT HUMERUS - 2+ VIEW COMPARISON:  None. FINDINGS: There are changes consistent with an anterior inferior dislocation of the right humeral head with respect to the glenoid labrum. No other focal abnormality is seen. IMPRESSION: Right humeral head dislocation Electronically Signed   By: Alcide Clever M.D.   On: 02/03/2016 12:32     IMPRESSION AND PLAN:   * UTI with acute encephalopathy IV ceftriaxone. Urine cultures to decide further antibiotic choice. No renal failure.  * Severe dehydration with hypernatremia Start half-normal saline. Monitor sodium.  *  Dislocated right shoulder. Discussed with Dr. Alphonzo Lemmings of emergency room who will attempt to reduce the dislocation.  * Mild elevation troponin Patient has had elevation of troponin chronically. We will repeat 2 more sets. Check CK level.  * Hypertension Hold Norvasc as blood pressure is in the low normal range.   * DVT prophylaxis with Lovenox   All the records are reviewed and case discussed with ED provider. Management plans discussed with the patient,  family and they are in agreement.  CODE STATUS: DNR  TOTAL TIME TAKING CARE OF THIS PATIENT: 40 minutes.    Milagros Loll R M.D on 02/03/2016 at 3:40 PM  Between 7am to 6pm - Pager - [phone]  After 6pm go to [email or web address] - password [email or web address] [facility]  [facility]  Office  [phone]  CC: Primary care physician; No PCP Per Patient   Note: This dictation was prepared with Dragon dictation along with smaller phrase technology. Any transcriptional errors that result from this process are unintentional.

## 2016-02-03 NOTE — ED Notes (Signed)
Patient arrived by EMS from [facility]. Spoke with worker Gasper Lloyd on the phone from [facility] who reports "When I came into work this morning patient had a R sided facial droop, a new big bruise on her R upper arm, and is no longer using her R arm like usual. " When asked if patient had fallen, Idell Pickles reported she was not told of any falls. Patient has HX of advanced dementia and does not usually speak per Idell Pickles from [facility]. V/S within Normal limits

## 2016-02-03 NOTE — ED Notes (Signed)
Admitting MD at bedside.

## 2016-02-03 NOTE — Progress Notes (Signed)
LCSW was consulted by Dr Alphonzo Lemmings. He reported he is concerned with possible level of care this patient has received while at this assisted living facility " [facility]" Called [facility]- and he reported this patient is already attached to APS and has a worker [employer], he reported that he has had a lengthy business arrangement with [facility]- co founder/owner of [facility] and has referred several aging persons there.He took down information, and reported he will look into this matter.  LCSW called and spoke to [facility] coordinator from [facility], she stated that her staff reported this patient fell. She gave this worker assurances that this matter and concern will be dealt with.   [employer] LCSW [phone]

## 2016-02-04 LAB — BASIC METABOLIC PANEL
ANION GAP: 5 (ref 5–15)
BUN: 16 mg/dL (ref 6–20)
CALCIUM: 7.7 mg/dL — AB (ref 8.9–10.3)
CHLORIDE: 116 mmol/L — AB (ref 101–111)
CO2: 26 mmol/L (ref 22–32)
Creatinine, Ser: 0.63 mg/dL (ref 0.44–1.00)
GFR calc non Af Amer: >60 mL/min (ref >60)
GLUCOSE: 97 mg/dL (ref 65–99)
Potassium: 3.4 mmol/L — ABNORMAL LOW (ref 3.5–5.1)
Sodium: 147 mmol/L — ABNORMAL HIGH (ref 135–145)

## 2016-02-04 LAB — CBC
HEMATOCRIT: 38.2 % (ref 35.0–47.0)
HEMOGLOBIN: 12.9 g/dL (ref 12.0–16.0)
MCH: 31.4 pg (ref 26.0–34.0)
MCHC: 33.9 g/dL (ref 32.0–36.0)
MCV: 92.7 fL (ref 80.0–100.0)
Platelets: 183 10*3/uL (ref 150–440)
RBC: 4.12 MIL/uL (ref 3.80–5.20)
RDW: 13.8 % (ref 11.5–14.5)
WBC: 9.7 10*3/uL (ref 3.6–11.0)

## 2016-02-04 LAB — TROPONIN I: Troponin I: 0.05 ng/mL — ABNORMAL HIGH (ref <0.031)

## 2016-02-04 MED ORDER — MUPIROCIN 2 % EX OINT
1.0000 "application " | TOPICAL_OINTMENT | Freq: Two times a day (BID) | CUTANEOUS | Status: DC
  Administered 2016-02-04 – 2016-02-06 (×6): 1 via NASAL
  Filled 2016-02-04: qty 22

## 2016-02-04 MED ORDER — ENOXAPARIN SODIUM 30 MG/0.3ML ~~LOC~~ SOLN
30.0000 mg | SUBCUTANEOUS | Status: DC
  Administered 2016-02-04 – 2016-02-05 (×2): 30 mg via SUBCUTANEOUS
  Filled 2016-02-04 (×2): qty 0.3

## 2016-02-04 MED ORDER — CHLORHEXIDINE GLUCONATE CLOTH 2 % EX PADS
6.0000 | MEDICATED_PAD | Freq: Every day | CUTANEOUS | Status: DC
  Administered 2016-02-04 – 2016-02-06 (×3): 6 via TOPICAL

## 2016-02-04 MED ORDER — SENNA 8.6 MG PO TABS: 1.0000 | ORAL_TABLET | Freq: Every day | ORAL | Status: DC | PRN

## 2016-02-04 NOTE — Progress Notes (Signed)
Initial Nutrition Assessment  DOCUMENTATION CODES:   Severe malnutrition in context of chronic illness  INTERVENTION:   Meals and Snacks: Cater to patient preferences; SLP following Medical Food Supplement Therapy: will recommend Mighty Shakes on meal trays TID and Magic Cup BID for added nutrition (each supplement provides approximately 300kcals and 9g protein)  NUTRITION DIAGNOSIS:   Malnutrition related to chronic illness as evidenced by severe depletion of muscle mass, severe depletion of body fat.  GOAL:   Patient will meet greater than or equal to 90% of their needs  MONITOR:    (Energy Intake, Electrolyte and renal Profile, Skin, Anthropometrics, DigestiveSystem)  REASON FOR ASSESSMENT:   Consult Assessment of nutrition requirement/status  ASSESSMENT:   Pt admitted with poor po intake, lethargy with UTI, hypernatremia and dislocated right shoulder. Per MD note pt nonverbal at baseline. Pt on isolation for MRSA.  Past Medical History  Diagnosis Date  . Hypertension   . Anxiety   . Severe major depression with psychotic features (HCC)   . Dementia   . Hyperlipidemia   . Edema   . Rosacea   . Polydipsia   . OA (ocular albinism) (HCC)     Diet Order:  DIET - DYS 1 Room service appropriate?: No; Fluid consistency:: Nectar Thick    Current Nutrition: Pt ate 0% this am. Per CNA Sherika, pt refused to open mouth for any liquids, food or applesauce. Per Nsg note pt too lethargic last night to eat.   Food/Nutrition-Related History: Per MD note on admission, pt with poor po intake PTA.   Scheduled Medications:  . aspirin EC  81 mg Oral Daily  . atorvastatin  20 mg Oral QHS  . cefTRIAXone (ROCEPHIN)  IV  1 g Intravenous Q24H  . Chlorhexidine Gluconate Cloth  6 each Topical Q0600  . docusate sodium  100 mg Oral BID  . donepezil  10 mg Oral QHS  . enoxaparin (LOVENOX) injection  30 mg Subcutaneous Q24H  . loratadine  10 mg Oral Daily  . mupirocin ointment  1  application Nasal BID  . polyethylene glycol  17 g Oral Daily  . sodium chloride flush  3 mL Intravenous Q12H    Continuous Medications:  . sodium chloride 100 mL/hr at 02/03/16 2158     Electrolyte/Renal Profile and Glucose Profile:   Recent Labs Lab 02/03/16 1202 02/04/16 0536  NA 150* 147*  K 4.0 3.4*  CL 111 116*  CO2 31 26  BUN 20 16  CREATININE 0.79 0.63  CALCIUM 9.8 7.7*  GLUCOSE 131* 97   Protein Profile:   Recent Labs Lab 02/03/16 1202  ALBUMIN 3.6    Gastrointestinal Profile: Last BM:  01/31/2016   Nutrition-Focused Physical Exam Findings: Nutrition-Focused physical exam completed. Findings are moderate-severe fat depletion, moderate-severe muscle depletion, and no edema.     Weight Change: Unable to clarify with pt, no weight trend in CHL   Height:   Ht Readings from Last 1 Encounters:  02/03/16 5' (1.524 m)    Weight:   Wt Readings from Last 1 Encounters:  02/04/16 95 lb 12.8 oz (43.455 kg)     BMI:  Body mass index is 18.71 kg/(m^2).  Estimated Nutritional Needs:   Kcal:  BEE: 811kcals, TEE: (IF 1.2-1.4)(AF 1.2) 1168-1363kcals  Protein:  48-57g protein (1.01-1.3g/kg)  Fluid:  1100-139mL of fluid (25-28mL/kg)  EDUCATION NEEDS:   No education needs identified at this time  HIGH Care Level  Leda Quail, RD, LDN Pager [phone] Weekend/On-Call Pager (  [phone]  

## 2016-02-04 NOTE — Progress Notes (Signed)
[facility] - [place] [facility]   PATIENT NAME: Jeanette Juarez    MR#:  [id number]  DATE OF BIRTH:  25-Jul-1929  SUBJECTIVE:  Unable to provide meaningful information given mental status medical condition  REVIEW OF SYSTEMS:   Unable to provide meaningful information given mental status medical condition DRUG ALLERGIES:  No Known Allergies  VITALS:  Blood pressure 127/52, pulse 97, temperature 98 F (36.7 C), temperature source Oral, resp. rate 18, height 5' (1.524 m), weight 43.455 kg (95 lb 12.8 oz), SpO2 96 %.  PHYSICAL EXAMINATION:   VITAL SIGNS: Filed Vitals:   02/03/16 2106 02/04/16 0513  BP:  127/52  Pulse: 88 97  Temp:  98 F (36.7 C)  Resp:  18   GENERAL:82 y.o.female moderate distress given mental status.  HEAD: Normocephalic, atraumatic.  EYES: Pupils equal, round, reactive to light. Unable to assess extraocular muscles given mental status/medical condition. No scleral icterus.  MOUTH: Moist mucosal membrane. Dentition intact. No abscess noted.  EAR, NOSE, THROAT: Clear without exudates. No external lesions.  NECK: Supple. No thyromegaly. No nodules. No JVD.  PULMONARY: Clear to ascultation, without wheeze rails or rhonci. No use of accessory muscles, Good respiratory effort. good air entry bilaterally CHEST: Nontender to palpation.  CARDIOVASCULAR: S1 and S2. Regular rate and rhythm. No murmurs, rubs, or gallops. No edema. Pedal pulses 2+ bilaterally.  GASTROINTESTINAL: Soft, nontender, nondistended. No masses. Positive bowel sounds. No hepatosplenomegaly.  MUSCULOSKELETAL: No swelling, clubbing, or edema. Range of motion full in all extremities.  NEUROLOGIC: Unable to assess given mental status/medical condition SKIN: No ulceration, lesions, rashes, or cyanosis. Skin warm and dry. Turgor intact.  PSYCHIATRIC: Unable to assess given mental status/medical condition        LABORATORY PANEL:   CBC  Recent Labs Lab  02/04/16 0536  WBC 9.7  HGB 12.9  HCT 38.2  PLT 183   ------------------------------------------------------------------------------------------------------------------  Chemistries   Recent Labs Lab 02/03/16 1202 02/04/16 0536  NA 150* 147*  K 4.0 3.4*  CL 111 116*  CO2 31 26  GLUCOSE 131* 97  BUN 20 16  CREATININE 0.79 0.63  CALCIUM 9.8 7.7*  AST 85*  --   ALT 42  --   ALKPHOS 83  --   BILITOT 1.1  --    ------------------------------------------------------------------------------------------------------------------  Cardiac Enzymes  Recent Labs Lab 02/04/16 0144  TROPONINI 0.05*   ------------------------------------------------------------------------------------------------------------------  RADIOLOGY:  Dg Chest 1 View  02/03/2016  CLINICAL DATA:  Altered mental status. EXAM: CHEST 1 VIEW COMPARISON:  01/17/2012 FINDINGS: Hyperinflation of the lungs. Heart is upper limits normal in size. Lungs are clear. No effusions. There is anterior dislocation of the right humeral head, new since 2013, age indeterminate. IMPRESSION: No active cardiopulmonary disease.  Mild hyperinflation. Anterior right shoulder dislocation. Electronically Signed   By: Charlett Nose M.D.   On: 02/03/2016 12:30   Dg Shoulder Right  02/03/2016  CLINICAL DATA:  Postreduction.  Initial encounter. EXAM: RIGHT SHOULDER - 2+ VIEW COMPARISON:  None. FINDINGS: Glenohumeral joint has been relocated. There is cortical irregularity at the greater tuberosity suggestive of nondisplaced fracture. No neck or head fracture identified. Osteopenia. IMPRESSION: 1. Relocated glenohumeral joint. 2. Probable nondisplaced greater tuberosity fracture. Electronically Signed   By: Marnee Spring M.D.   On: 02/03/2016 16:28   Ct Head Wo Contrast  02/03/2016  CLINICAL DATA:  Right facial droop and bruise of the right upper arm since this morning. EXAM: CT HEAD WITHOUT CONTRAST TECHNIQUE: Contiguous axial  images were  obtained from the base of the skull through the vertex without intravenous contrast. COMPARISON:  January 17, 2015 FINDINGS: There is chronic diffuse atrophy. Chronic bilateral periventricular white matter small vessel ischemic changes noted. Compensatory ventricular dilatation from cerebral atrophy is identified unchanged. There is no midline shift or mass. Small old lacune infarction is identified in the right caudate head unchanged. There is no acute hemorrhage or acute transcortical infarct. The bony calvarium is intact. The visualized sinuses are clear. IMPRESSION: No focal acute intracranial abnormality identified. Chronic diffuse atrophy. Chronic bilateral periventricular white matter small vessel ischemic change. Electronically Signed   By: Sherian Rein M.D.   On: 02/03/2016 12:48   Dg Humerus Right  02/03/2016  CLINICAL DATA:  Upper arm bruising and likely recent fall although a poor historian EXAM: RIGHT HUMERUS - 2+ VIEW COMPARISON:  None. FINDINGS: There are changes consistent with an anterior inferior dislocation of the right humeral head with respect to the glenoid labrum. No other focal abnormality is seen. IMPRESSION: Right humeral head dislocation Electronically Signed   By: Alcide Clever M.D.   On: 02/03/2016 12:32    EKG:   Orders placed or performed during the hospital encounter of 02/03/16  . ED EKG  . ED EKG  . EKG 12-Lead  . EKG 12-Lead    ASSESSMENT AND PLAN:   82 year old Caucasian female admitted 02/03/16 from [place] [facility] with altered mental status. Found to have shoulder dislocation as well  1. Acute encephalopathy secondary to urinary tract infection site unspecified: Baseline dementia, continue ceftriaxone follow urine culture and adjust antibiotics accordingly 2. Hypernatremia secondary to poor by mouth intake: Improving continue half normal saline and recheck BMP in the morning  3.Dislocated right shoulder. Status post reduction  4. Mild elevation troponin:  Stabilized 5. Essential hypertension: Norvasc currently held restart blood pressure elevates 6. Hyperlipidemia unspecified statin therapy 7. Dementia: Aricept 8. DVT prophylaxis with Lovenox     All the records are reviewed and case discussed with Care Management/Social Workerr. Management plans discussed with the patient, family and they are in agreement.  CODE STATUS: DO NOT RESUSCITATE  TOTAL TIME TAKING CARE OF THIS PATIENT: 33 minutes.   POSSIBLE D/C IN 2-3 DAYS, DEPENDING ON CLINICAL CONDITION.   Hower,  Mardi Mainland.D on 02/04/2016 at 12:30 PM  Between 7am to 6pm - Pager - [phone]  After 6pm: House Pager: - [phone]  [facility]  Office  [phone]  CC: Primary care physician; No PCP Per Patient

## 2016-02-04 NOTE — Progress Notes (Signed)
LCSW consulted with LCSW Dede Query in discussion to reduce 3rd party reporting LCSW will have Dr Alphonzo Lemmings call APS  Deetta Perla to report his concerns directly. LCSW left all details and APS report number with Elnita Maxwell CM and she will make sure he gets it.  LCSW will call Joyce Gross Myself to ensure this has been reported.  Claudijne [place] LCSW [phone]

## 2016-02-04 NOTE — Evaluation (Signed)
Clinical/Bedside Swallow Evaluation Patient Details  Name: Jeanette Juarez MRN: [id number] Date of Birth: [birth year]  Today's Date: 02/04/2016 Time: SLP Start Time (ACUTE ONLY): 1130 SLP Stop Time (ACUTE ONLY): 1215 SLP Time Calculation (min) (ACUTE ONLY): 45 min  Past Medical History:  Past Medical History  Diagnosis Date  . Hypertension   . Anxiety   . Severe major depression with psychotic features (HCC)   . Dementia   . Hyperlipidemia   . Edema   . Rosacea   . Polydipsia   . OA (ocular albinism) (HCC)    Past Surgical History: History reviewed. No pertinent past surgical history. HPI:  Pt is a 82 y.o. female with a known history of hypertension, dementia, major depression w/ psychotic features who presents from nursing home due to fall and bruising on right shoulder. Patient has had decreased oral intake. Here patient is nonverbal and history obtained from emergency room staff and old records. Family not available. Attempts made to get numbers from nursing home. Patient has been found to have a dislocated right shoulder along with UTI and hyper natremia. Reports from NH today are that pt has times when "all they can do is get her medicine in her" when she is "like this" referring to nonverbal and poorly interactive and receptive to environment as seen today. Unsure of pt's diet and how much she has been eating recently.    Assessment / Plan / Recommendation Clinical Impression  This was a limited evaluation w/ po trials of single ice chips only. Pt rejected any other bolus trial attempts. Pt accepted 2 trials of single ice chips placed at lips then placed anteriorly in mouth when pt opened her mouth given verbal/tactile stim. Adequate oral management was noted followed by brief mastication and crunching of ice chips heard. No overt s/s of apsiration (coughing or throat clearing was noted). Upon presentation of the 3rd ice chip trial to lips, pt clenched mouth/lips and turned head away  despite verbal/tactile encouragement; similar was noted w/ trial of ice cream put to lips. Per report by NSG from NH, this is similar presentation by pt when she has not been "feeling well" and minimal oral intake has been achieved by the staff there. Pt appears to present w/ increased risk for aspiration/dysphagia sec. to declined Cognitive status; she is poorly receptive to po trials/presentation at this time. Rec. diet modification to a Dys. 1 w/ nectar liquids w/ strict aspiration precautions - trials by NSG to determine pt's acceptance level then ongoing monitoring for overt s/s of aspriration w/ the po consistencies. ST will f/u w/ ongoing assessment. NSG and Dietician updated. Stop po's if any s/s of aspiration or decline in status noted. NSG agreed.     Aspiration Risk  Moderate aspiration risk    Diet Recommendation  Dys. 1 w/ Nectar liquids; strict aspiration precautions; give po's only when fully participative in task - stop if any overt s/s of aspiration noted. 100% supervision at meals; assistance.   Medication Administration: Crushed with puree (as able)    Other  Recommendations Recommended Consults:  (Dietian; Palliative Care consult) Oral Care Recommendations: Oral care BID;Staff/trained caregiver to provide oral care Other Recommendations: Order thickener from pharmacy;Prohibited food (jello, ice cream, thin soups);Remove water pitcher   Follow up Recommendations  Skilled Nursing facility    Frequency and Duration min 3x week  2 weeks       Prognosis Prognosis for Safe Diet Advancement: Guarded Barriers to Reach Goals: Cognitive deficits;Severity of deficits  Swallow Study   General Date of Onset: 02/03/16 HPI: Pt is a 82 y.o. female with a known history of hypertension, dementia, major depression w/ psychotic features who presents from nursing home due to fall and bruising on right shoulder. Patient has had decreased oral intake. Here patient is nonverbal and history  obtained from emergency room staff and old records. Family not available. Attempts made to get numbers from nursing home. Patient has been found to have a dislocated right shoulder along with UTI and hyper natremia. Reports from NH today are that pt has times when "all they can do is get her medicine in her" when she is "like this" referring to nonverbal and poorly interactive and receptive to environment as seen today. Unsure of pt's diet and how much she has been eating recently.  Type of Study: Bedside Swallow Evaluation Previous Swallow Assessment: unknown Diet Prior to this Study: Regular (from MD currently) Temperature Spikes Noted: No (wbc 9.7) Respiratory Status: Room air History of Recent Intubation: No Behavior/Cognition: Confused;Uncooperative;Doesn't follow directions (awake) Oral Cavity Assessment:  (unable to assess) Oral Care Completed by SLP:  (attempted but pt rejected attempts) Oral Cavity - Dentition:  (few dentition noted when pt chewed ice) Vision:  (n/a) Self-Feeding Abilities: Total assist Patient Positioning: Upright in bed Baseline Vocal Quality:  (nonverbal) Volitional Cough: Cognitively unable to elicit Volitional Swallow: Unable to elicit    Oral/Motor/Sensory Function Overall Oral Motor/Sensory Function:  (unable to assess sec. to pt's Cognitive status)   Ice Chips Ice chips: Impaired Presentation: Spoon (x3 attempts; 2 trials taken and 1 rejected) Oral Phase Impairments: Poor awareness of bolus (clenched teeth/lips; would not open for boluses trials) Oral Phase Functional Implications:  (none) Pharyngeal Phase Impairments:  (none noted w/ the 2 trials) Other Comments: suspect decreased oral awareness and readiness/acceptance is d/t declined Cognitive status   Thin Liquid Thin Liquid: Not tested    Nectar Thick Nectar Thick Liquid: Not tested   Honey Thick Honey Thick Liquid: Not tested   Puree Puree: Not tested Other Comments: rejected turning head away;  clenched mouth   Solid   GO   Solid: Not tested       Jerilynn Som, MS, CCC-SLP  Watson,Katherine 02/04/2016,6:46 PM

## 2016-02-04 NOTE — Plan of Care (Signed)
Problem: Health Behavior/Discharge Planning: Goal: Ability to manage health-related needs will improve Outcome: Progressing [facility] facility to fax medication records and paperwork regarding legal guardian today  Problem: Pain Managment: Goal: General experience of comfort will improve Outcome: Progressing No s/s of pain or discomfort. Q2h turning provided.  Problem: Nutrition: Goal: Adequate nutrition will be maintained Outcome: Not Progressing Dietary consulted for nutrition status and recommendations. Pt too lethargic to eat dinner last night

## 2016-02-05 DIAGNOSIS — E43 Unspecified severe protein-calorie malnutrition: Secondary | ICD-10-CM | POA: Insufficient documentation

## 2016-02-05 LAB — MAGNESIUM: Magnesium: 1.9 mg/dL (ref 1.7–2.4)

## 2016-02-05 LAB — BASIC METABOLIC PANEL
ANION GAP: 5 (ref 5–15)
BUN: 17 mg/dL (ref 6–20)
CALCIUM: 7.6 mg/dL — AB (ref 8.9–10.3)
CO2: 23 mmol/L (ref 22–32)
Chloride: 116 mmol/L — ABNORMAL HIGH (ref 101–111)
Creatinine, Ser: 0.64 mg/dL (ref 0.44–1.00)
GFR calc Af Amer: >60 mL/min (ref >60)
GFR calc non Af Amer: >60 mL/min (ref >60)
GLUCOSE: 80 mg/dL (ref 65–99)
Potassium: 3 mmol/L — ABNORMAL LOW (ref 3.5–5.1)
Sodium: 144 mmol/L (ref 135–145)

## 2016-02-05 MED ORDER — POTASSIUM CHLORIDE 10 MEQ/100ML IV SOLN
10.0000 meq | INTRAVENOUS | Status: AC
  Administered 2016-02-05 (×4): 10 meq via INTRAVENOUS
  Filled 2016-02-05 (×4): qty 100

## 2016-02-05 MED ORDER — POTASSIUM CHLORIDE CRYS ER 20 MEQ PO TBCR
40.0000 meq | EXTENDED_RELEASE_TABLET | Freq: Once | ORAL | Status: DC
  Filled 2016-02-05: qty 2

## 2016-02-05 NOTE — Consult Note (Addendum)
Palliative Medicine Inpatient Consult Note   Name: Jeanette Juarez Date: 02/05/2016 MRN: [id number]  DOB: 12-19-1929  Referring Physician: Wyatt Haste, MD  Palliative Care consult requested for this 82 y.o. female for goals of medical therapy in patient who had a fall at a assisted living facility ([name]) resulting in a dislocated right shoulder.  TODAY'S DISCUSSIONS AND DECISIONS: Pt is at end stage of dementia.  She has had trouble eating 'for months' per her guardian. There is one aide at [facility] who can sometimes get pt to eat. However, pt is not eating anything here and has no interest at all in eating or drinking. She is alert due to the IV fluids going in. She has reached the end stage of her Alzheimer's dementia. She has complete loss of appetite and is nonverbal and nonambulatory with contractures.  She has bruising and a recently dislocated right shoulder which causes her some pain as seen when she winces. She has trouble with her dry eyes with an everted left lower eyelid --which she is able to rub. She is not able to communicate by gestures or words or expressions.    I informed her guardian that I could recommend pt going back to her prior facility setting as long as she had involvement of Hospice and as long as staff would recognize that she would likely be under a terminal care situation --and that she would not be returning as she has been in the past.  I also mentioned that I also feel quite comfortable recommending Hospice Home as another option, since pt is not expected to live for more than a few weeks given that she is not eating or drinking.    Pts guardian, [name], opted to select Hospice Home for pt's care at this time.  I have notified social worker and Hospice Liaison nurse, Renea Ee RN, who is here today.    I will change medications to comfort meds and will do DC med rec and update pts attending.  I have notified nurse who is stopping the telemetry  and a few other aggressive things that are not helping pt at this point in her life--given that she is at end of life.  I have signed the Rxs and started a Discharge envolope with pertinent papers inside the envelope (all to go to [facility] tomorrow -including this note).    Pt will go to [facility] tomorrow apparently.   ---------------------------------------------------------------   BRIEF HISTORY: Pt is an 82 yo woman who has dementia who fell at a nursing facility. She had a bruised right shoulder that was found to be dislocated.  Shoulder was reduced in ED by Dr. Alphonzo Lemmings. She also had a UTI and hypernatremia c/w dehydration.  She herself is nonverbal and it turns out she has a guardian at DSS.   Pt had a bedside swallowing eval showing her to be at moderate risk for aspiration. She was started here on a Dysphagia 1 diet with NECTAR thick liquids.  She needs po ONLY when participating and feeding is to stop when she is not.  100% supervision and assistance is needed.  Meds can be given with pureed as able.   Pt did not eat on 2/16 apparently.  Her albumin is 2.3 indicating severe malnutrition associated with advanced/ end stage dementia.   Pt has some bruising and had some wincing when I examined her shoulder area --indicating  pain.  She did open her eyes and focus  on me briefly but she did not gesture or change facial expressions or speak. She is thus noncommunicative (which is c/w her end stage dementia).     IMPRESSION: Dementia of Alzheimer's type on Aricept ---advanced as pt is nonverbal and nonambulatory, has contractures, does not recognize food as food, has no appetite, etc. m She does not communicate but can focus.   SEVERE Malnutrition (albumin is 2.3) ---due to end stage Alzheimer's Dementia UTI Metabolic Encephalopathy Mildly Elevated Troponins due to demand ischemia HTN Anxiety and severe major depression in past (with pyschotic features) Dyslipidemia Dependent  Edema Rosacea Polydipsia Dry eyes and everted left lower eyelid ---pt continuously rubs her eyelids She has flexion contractures of legs   REVIEW OF SYSTEMS:  Patient is not able to provide ROS due to end stage dementia  SPIRITUAL SUPPORT SYSTEM: She has a DSS guardian, [name]. .  SOCIAL HISTORY:  reports that she has never smoked. She has never used smokeless tobacco. She reports that she does not drink alcohol or use illicit drugs. DSS is Guardian.  [facility] was contacted regarding pts fall at the ALF.  Pt is total care at the [facility]F [name]). She has been there for several years and facility is reportedly agreeable to pt returning there.  DSS guardian was interested in learning whether more could be done perhaps in a SNF or Hospice facility.  DSS Guardian is [name] at [phone].  Alternate number is [phone].      LEGAL DOCUMENTS:  There is a portable DNR form in the paper chart. There is a guardianship document in the paper chart.   CODE STATUS: DNR  PAST MEDICAL HISTORY: Past Medical History  Diagnosis Date  . Hypertension   . Anxiety   . Severe major depression with psychotic features (HCC)   . Dementia   . Hyperlipidemia   . Edema   . Rosacea   . Polydipsia   . OA (ocular albinism) (HCC)     PAST SURGICAL HISTORY: History reviewed. No pertinent past surgical history.  ALLERGIES:  has No Known Allergies.  MEDICATIONS:  Current Facility-Administered Medications  Medication Dose Route Frequency Provider Last Rate Last Dose  . 0.45 % sodium chloride infusion   Intravenous Continuous Milagros Loll, MD 100 mL/hr at 02/05/16 0705    . acetaminophen (TYLENOL) tablet 650 mg  650 mg Oral Q6H PRN Milagros Loll, MD       Or  . acetaminophen (TYLENOL) suppository 650 mg  650 mg Rectal Q6H PRN Srikar Sudini, MD      . albuterol (PROVENTIL) (2.5 MG/3ML) 0.083% nebulizer solution 2.5 mg  2.5 mg Nebulization Q2H PRN Srikar Sudini, MD      . aspirin EC tablet 81 mg  81  mg Oral Daily Milagros Loll, MD   81 mg at 02/03/16 1932  . atorvastatin (LIPITOR) tablet 20 mg  20 mg Oral QHS Milagros Loll, MD   20 mg at 02/04/16 2124  . bisacodyl (DULCOLAX) suppository 10 mg  10 mg Rectal Daily PRN Srikar Sudini, MD      . cefTRIAXone (ROCEPHIN) 1 g in dextrose 5 % 50 mL IVPB  1 g Intravenous Q24H Srikar Sudini, MD   1 g at 02/05/16 1026  . Chlorhexidine Gluconate Cloth 2 % PADS 6 each  6 each Topical Q0600 Wyatt Haste, MD   6 each at 02/05/16 [id number]  . docusate sodium (COLACE) capsule 100 mg  100 mg Oral BID Milagros Loll, MD   100 mg  at 02/04/16 2124  . donepezil (ARICEPT) tablet 10 mg  10 mg Oral QHS Milagros Loll, MD   10 mg at 02/04/16 2124  . enoxaparin (LOVENOX) injection 30 mg  30 mg Subcutaneous Q24H Wyatt Haste, MD   30 mg at 02/04/16 1539  . loratadine (CLARITIN) tablet 10 mg  10 mg Oral Daily Milagros Loll, MD   10 mg at 02/03/16 1934  . morphine 2 MG/ML injection 2 mg  2 mg Intravenous Q4H PRN Srikar Sudini, MD      . mupirocin ointment (BACTROBAN) 2 % 1 application  1 application Nasal BID Wyatt Haste, MD   1 application at 02/05/16 1017  . ondansetron (ZOFRAN) tablet 4 mg  4 mg Oral Q6H PRN Milagros Loll, MD       Or  . ondansetron (ZOFRAN) injection 4 mg  4 mg Intravenous Q6H PRN Srikar Sudini, MD      . oxyCODONE (Oxy IR/ROXICODONE) immediate release tablet 5 mg  5 mg Oral Q4H PRN Srikar Sudini, MD      . polyethylene glycol (MIRALAX / GLYCOLAX) packet 17 g  17 g Oral Daily PRN Srikar Sudini, MD      . polyethylene glycol (MIRALAX / GLYCOLAX) packet 17 g  17 g Oral Daily Milagros Loll, MD   17 g at 02/03/16 1934  . potassium chloride 10 mEq in 100 mL IVPB  10 mEq Intravenous Q1 Hr x 4 Wyatt Haste, MD      . potassium chloride SA (K-DUR,KLOR-CON) CR tablet 40 mEq  40 mEq Oral Once Wyatt Haste, MD   40 mEq at 02/05/16 1019  . senna (SENOKOT) tablet 8.6 mg  1 tablet Oral Daily PRN Wyatt Haste, MD      . sodium chloride flush (NS) 0.9 % injection 3 mL   3 mL Intravenous Q12H Milagros Loll, MD   3 mL at 02/04/16 2124    Vital Signs: BP 83/56 mmHg  Pulse 60  Temp(Src) 98.3 F (36.8 C) (Oral)  Resp 17  Ht 5' (1.524 m)  Wt 44.044 kg (97 lb 1.6 oz)  BMI 18.96 kg/m2  SpO2 95% Filed Weights   02/03/16 1227 02/04/16 0428 02/05/16 0452  Weight: 40.279 kg (88 lb 12.8 oz) 43.455 kg (95 lb 12.8 oz) 44.044 kg (97 lb 1.6 oz)    Estimated body mass index is 18.96 kg/(m^2) as calculated from the following:   Height as of this encounter: 5' (1.524 m).   Weight as of this encounter: 44.044 kg (97 lb 1.6 oz).  PERFORMANCE STATUS (ECOG) : 4 - Bedbound  PHYSICAL EXAM: Lying in medical bed with tele wires on. She is rubbing her eyes. Everted left lower eyelid She opens eyes and focuses on me briefly and demonstrates that she is fully alert. However, she does not make any attempt to communicate with facial expression or gestures or speech.  Hrt rrr no m Lungs cta ant Abd soft and NT Ext no cyanosis or mottling Skin warm and pale and dry  Contracted lower extremeties (moderate) Her fingernails are quite dirty but otherwise she is clean.   LABS: CBC:    Component Value Date/Time   WBC 9.7 02/04/2016 0536   WBC 5.4 01/18/2012 0423   HGB 12.9 02/04/2016 0536   HGB 13.0 01/18/2012 0423   HCT 38.2 02/04/2016 0536   HCT 39.0 01/18/2012 0423   PLT 183 02/04/2016 0536   PLT 219 01/18/2012 0423   MCV 92.7 02/04/2016 0536  MCV 94 01/18/2012 0423   NEUTROABS 11.4* 02/03/2016 1202   NEUTROABS 3.1 01/18/2012 0423   LYMPHSABS 1.6 02/03/2016 1202   LYMPHSABS 1.7 01/18/2012 0423   MONOABS 1.1* 02/03/2016 1202   MONOABS 0.4 01/18/2012 0423   EOSABS 0.0 02/03/2016 1202   EOSABS 0.1 01/18/2012 0423   BASOSABS 0.0 02/03/2016 1202   BASOSABS 0.0 01/18/2012 0423   Comprehensive Metabolic Panel:    Component Value Date/Time   NA 144 02/05/2016 0454   NA 147* 01/19/2012 0445   K 3.0* 02/05/2016 0454   K 3.4* 01/19/2012 0445   CL 116*  02/05/2016 0454   CL 109* 01/19/2012 0445   CO2 23 02/05/2016 0454   CO2 28 01/19/2012 0445   BUN 17 02/05/2016 0454   BUN 5* 01/19/2012 0445   CREATININE 0.64 02/05/2016 0454   CREATININE 0.79 01/19/2012 0445   GLUCOSE 80 02/05/2016 0454   GLUCOSE 86 01/19/2012 0445   CALCIUM 7.6* 02/05/2016 0454   CALCIUM 8.9 01/19/2012 0445   AST 85* 02/03/2016 1202   ALT 42 02/03/2016 1202   ALKPHOS 83 02/03/2016 1202   BILITOT 1.1 02/03/2016 1202   PROT 8.2* 02/03/2016 1202   ALBUMIN 3.6 02/03/2016 1202    More than 50% of the visit was spent in counseling/coordination of care: Yes  Time Spent: 80 minutes

## 2016-02-05 NOTE — Progress Notes (Signed)
Patient has refused everything PO this shift. MD aware. Palliative consult placed. Bo Mcclintock, RN

## 2016-02-05 NOTE — Care Management Important Message (Signed)
Important Message  Patient Details  Name: Jeanette Juarez MRN: [id number] Date of Birth: 02-09-1929   Medicare Important Message Given:  Yes    Gwenette Greet, RN 02/05/2016, 11:25 AM

## 2016-02-05 NOTE — Progress Notes (Signed)
Speech Therapy Note: reviewed chart notes; consulted NSG. NSG reported pt exhibits refusal behavior then po foods/liquids or meds are presented to her and/or placed at her lips/mouth. NSG has been unable to get pt to take any oral intake today including her meds. Due to the decline in status, a Palliative Care consult has been placed. ST will f/u w/ pt's status and MD's poc for pt next 1-2 days. The dysphagia 1 diet w/ Nectar liquids remains in place; strict aspiration precautions and feeding only when fully participating w/ NSG. NSG agreed.

## 2016-02-05 NOTE — Progress Notes (Addendum)
[facility] [place] at [facility]   PATIENT NAME: Jeanette Juarez    MR#:  [id number]  DATE OF BIRTH:  [birth year]  SUBJECTIVE:  Unable to provide meaningful information given mental status medical condition Spoke With DSS caseworker at bedside  REVIEW OF SYSTEMS:   Unable to provide meaningful information given mental status medical condition DRUG ALLERGIES:  No Known Allergies  VITALS:  Blood pressure 83/56, pulse 60, temperature 98.3 F (36.8 C), temperature source Oral, resp. rate 17, height 5' (1.524 m), weight 44.044 kg (97 lb 1.6 oz), SpO2 95 %.  PHYSICAL EXAMINATION:   VITAL SIGNS: Filed Vitals:   02/05/16 0452 02/05/16 1212  BP: 127/51 83/56  Pulse: 81 60  Temp: 98.3 F (36.8 C)   Resp: 20 17   GENERAL:82 y.o.female moderate distress given mental status.  HEAD: Normocephalic, atraumatic.  EYES: Pupils equal, round, reactive to light. Unable to assess extraocular muscles given mental status/medical condition. No scleral icterus.  MOUTH: poor mucosal membrane. Dentition poor No abscess noted.  EAR, NOSE, THROAT: Clear without exudates. No external lesions.  NECK: Supple. No thyromegaly. No nodules. No JVD.  PULMONARY: Clear to ascultation, without wheeze rails or rhonci. No use of accessory muscles, Good respiratory effort. good air entry bilaterally CHEST: Nontender to palpation.  CARDIOVASCULAR: S1 and S2. Regular rate and rhythm. No murmurs, rubs, or gallops. No edema. Pedal pulses 2+ bilaterally.  GASTROINTESTINAL: Soft, nontender, nondistended. No masses. Positive bowel sounds. No hepatosplenomegaly.  MUSCULOSKELETAL: No swelling, clubbing, or edema. Range of motion full in all extremities.  NEUROLOGIC: Unable to assess given mental status/medical condition SKIN: No ulceration, lesions, rashes, or cyanosis. Skin warm and dry. Turgor intact.  PSYCHIATRIC: Unable to assess given mental status/medical condition        LABORATORY  PANEL:   CBC  Recent Labs Lab 02/04/16 0536  WBC 9.7  HGB 12.9  HCT 38.2  PLT 183   ------------------------------------------------------------------------------------------------------------------  Chemistries   Recent Labs Lab 02/03/16 1202  02/05/16 0454  NA 150*  < > 144  K 4.0  < > 3.0*  CL 111  < > 116*  CO2 31  < > 23  GLUCOSE 131*  < > 80  BUN 20  < > 17  CREATININE 0.79  < > 0.64  CALCIUM 9.8  < > 7.6*  MG  --   --  1.9  AST 85*  --   --   ALT 42  --   --   ALKPHOS 83  --   --   BILITOT 1.1  --   --   < > = values in this interval not displayed. ------------------------------------------------------------------------------------------------------------------  Cardiac Enzymes  Recent Labs Lab 02/04/16 0144  TROPONINI 0.05*   ------------------------------------------------------------------------------------------------------------------  RADIOLOGY:  Dg Shoulder Right  02/03/2016  CLINICAL DATA:  Postreduction.  Initial encounter. EXAM: RIGHT SHOULDER - 2+ VIEW COMPARISON:  None. FINDINGS: Glenohumeral joint has been relocated. There is cortical irregularity at the greater tuberosity suggestive of nondisplaced fracture. No neck or head fracture identified. Osteopenia. IMPRESSION: 1. Relocated glenohumeral joint. 2. Probable nondisplaced greater tuberosity fracture. Electronically Signed   By: Marnee Spring M.D.   On: 02/03/2016 16:28    EKG:   Orders placed or performed during the hospital encounter of 02/03/16  . ED EKG  . ED EKG  . EKG 12-Lead  . EKG 12-Lead    ASSESSMENT AND PLAN:   82 year old Caucasian female admitted 02/03/16 from [facility] with altered mental status.  Found to have shoulder dislocation as well  1. Acute on chronic metabolic encephalopathy secondary to urinary tract infection site unspecified: actually close to baseline, ceftriaxone day 3/3  2. Hypernatremia secondary to poor by mouth intake: Improving continue half  normal saline and recheck BMP in the morning: Continued poor oral intake  3.Dislocated right shoulder. Status post reduction  4. Mild elevation troponin: Stabilized 5. Essential hypertension: Norvasc currently held restart blood pressure elevates 6. Hyperlipidemia unspecified statin therapy 7. Dementia: Aricept 8. DVT prophylaxis with Lovenox   disposition: Palliative care consult question hospice versus retransfer skilled    All the records are reviewed and case discussed with Care Management/Social Workerr. Management plans discussed with the patient, family and they are in agreement.  CODE STATUS: DO NOT RESUSCITATE  TOTAL TIME TAKING CARE OF THIS PATIENT: 28 minutes.   POSSIBLE D/C IN 2-3 DAYS, DEPENDING ON CLINICAL CONDITION.   Rosann Gorum,  Mardi Mainland.D on 02/05/2016 at 1:06 PM  Between 7am to 6pm - Pager - [phone]  After 6pm: House Pager: - [phone]  [facility]  Office  [phone]  CC: Primary care physician; No PCP Per Patient

## 2016-02-05 NOTE — Progress Notes (Signed)
LCSW provided EDP Dr Alphonzo Lemmings with [employer] [phone] to report his concerns 2pm. EDP called at 4:30 and stated he was unable to reach [employer] and felt it was wrong number. LCSW called Deetta Perla ( pts DSS social worker) and she reported the number was correct but that line was most likely busy. She verified at 5:20pm last night a report had not been filed. LCSW explained Dr Iline Oven concerns. LCSW called ED secretary and was unable to reach Dr Alphonzo Lemmings left message with ED. LCSW will consult with Estrella Myrtle and follow up again.   [employer] LCSW  [phone]

## 2016-02-05 NOTE — Clinical Social Work Note (Signed)
Clinical Social Work Assessment  Patient Details  Name: Jeanette Juarez MRN: [id number] Date of Birth: [birth year]  Date of referral:  02/05/16               Reason for consult:  Facility Placement                Permission sought to share information with:  Guardian Permission granted to share information::  Yes, Verbal Permission Granted  Name::     [name]  Agency::  [facility]  Relationship::   Guardian Social Worker  Contact Information:   [phone]  Housing/Transportation Living arrangements for the past 2 months:  Assisted Living Facility Source of Information:  Guardian Patient Interpreter Needed:  None Criminal Activity/Legal Involvement Pertinent to Current Situation/Hospitalization:  No - Comment as needed Significant Relationships:  Other(Comment) Lives with:  Facility Resident Do you feel safe going back to the place where you live?  Yes Need for family participation in patient care:  Yes (Comment)  Care giving concerns:  EDP spoke with DSS regarding concerns at facility   Social Worker assessment / plan:  CSW spoke to pt's guardian to addres consult. Pt was admitted from [facility] after a fall out of bed. DSS is aware of situation and is addressing it. Pt is a total care pt at ALF, where she has been living for several years. DSS is agreeable to pt returning to facility. DSS is also agreeable pt a Palliative Care consult. DSS has been following to assess if more can be done, for instance SNF or Hospice. CSW asked for a Palliative Consult. CSW will follow up with facility. CSW will continue to follow.   Employment status:  Retired Health and safety inspector:  Armed forces operational officer, Medicaid In [place] PT Recommendations:  Not assessed at this time Information / Referral to community resources:  Other (Comment Required)  Patient/Family's Response to care:  DSS was appreciative of CSW assistance.   Patient/Family's Understanding of and Emotional Response to Diagnosis,  Current Treatment, and Prognosis:  DSS would like pt to return to facility, if possible.   Emotional Assessment Appearance:  Appears older than stated age Attitude/Demeanor/Rapport:  Unable to Assess Affect (typically observed):  Unable to Assess Orientation:  Fluctuating Orientation (Suspected and/or reported Sundowners) Alcohol / Substance use:  Never Used Psych involvement (Current and /or in the community):  No (Comment)  Discharge Needs  Concerns to be addressed:  Adjustment to Illness Readmission within the last 30 days:   No Current discharge risk:  Chronically ill Barriers to Discharge:  Continued Medical Work up   [employer], LCSW 02/05/2016, 3:12 PM

## 2016-02-05 NOTE — Clinical Documentation Improvement (Signed)
Internal Medicine  Can the diagnosis of encephalopathy be further specified in progress notes and discharge summary?   Metabolic encephalopathy  Other  Clinically Undetermined  Document any associated diagnoses/conditions.   Supporting Information:  82 year old female  H&P: * UTI with acute encephalopathy IV ceftriaxone. Urine cultures to decide further antibiotic choice. No renal failure.  2/15 progress note: 1. Acute encephalopathy secondary to urinary tract infection site unspecified: Baseline dementia, continue ceftriaxone follow urine culture and adjust antibiotics accordingly  Please exercise your independent, professional judgment when responding. A specific answer is not anticipated or expected.   Thank You,  Harless Litten Health Information Management [place] [phone]

## 2016-02-06 DIAGNOSIS — R1310 Dysphagia, unspecified: Secondary | ICD-10-CM

## 2016-02-06 DIAGNOSIS — R630 Anorexia: Secondary | ICD-10-CM

## 2016-02-06 DIAGNOSIS — E870 Hyperosmolality and hypernatremia: Secondary | ICD-10-CM

## 2016-02-06 DIAGNOSIS — M2450 Contracture, unspecified joint: Secondary | ICD-10-CM

## 2016-02-06 DIAGNOSIS — E860 Dehydration: Secondary | ICD-10-CM

## 2016-02-06 DIAGNOSIS — G9341 Metabolic encephalopathy: Secondary | ICD-10-CM

## 2016-02-06 DIAGNOSIS — S43004S Unspecified dislocation of right shoulder joint, sequela: Secondary | ICD-10-CM

## 2016-02-06 DIAGNOSIS — Z9181 History of falling: Secondary | ICD-10-CM

## 2016-02-06 DIAGNOSIS — T148XXA Other injury of unspecified body region, initial encounter: Secondary | ICD-10-CM | POA: Insufficient documentation

## 2016-02-06 DIAGNOSIS — G308 Other Alzheimer's disease: Secondary | ICD-10-CM

## 2016-02-06 DIAGNOSIS — F0280 Dementia in other diseases classified elsewhere without behavioral disturbance: Secondary | ICD-10-CM

## 2016-02-06 DIAGNOSIS — H578 Other specified disorders of eye and adnexa: Secondary | ICD-10-CM

## 2016-02-06 DIAGNOSIS — N390 Urinary tract infection, site not specified: Principal | ICD-10-CM

## 2016-02-06 LAB — ALBUMIN: ALBUMIN: 2.3 g/dL — AB (ref 3.5–5.0)

## 2016-02-06 LAB — CK: CK TOTAL: 813 U/L — AB (ref 38–234)

## 2016-02-06 MED ORDER — ACETAMINOPHEN 650 MG RE SUPP: 650.0000 mg | RECTAL | Status: AC | PRN

## 2016-02-06 MED ORDER — MORPHINE SULFATE (CONCENTRATE) 10 MG/0.5ML PO SOLN
5.0000 mg | Freq: Three times a day (TID) | ORAL | Status: DC
  Administered 2016-02-06 – 2016-02-07 (×4): 5 mg via ORAL
  Filled 2016-02-06: qty 1

## 2016-02-06 MED ORDER — LUBRIFRESH P.M. OP OINT: 1.0000 "application " | TOPICAL_OINTMENT | OPHTHALMIC | Status: AC | PRN

## 2016-02-06 MED ORDER — BISACODYL 10 MG RE SUPP: 10.0000 mg | Freq: Every day | RECTAL | Status: AC | PRN

## 2016-02-06 MED ORDER — MORPHINE SULFATE (CONCENTRATE) 10 MG/0.5ML PO SOLN
5.0000 mg | ORAL | Status: DC | PRN
  Filled 2016-02-06 (×3): qty 1

## 2016-02-06 MED ORDER — PROCHLORPERAZINE 25 MG RE SUPP: 25.0000 mg | Freq: Three times a day (TID) | RECTAL | Status: AC | PRN

## 2016-02-06 MED ORDER — LORAZEPAM 0.5 MG PO TABS: 0.5000 mg | ORAL_TABLET | ORAL | Status: AC | PRN

## 2016-02-06 MED ORDER — GLYCOPYRROLATE 1 MG PO TABS: 1.0000 mg | ORAL_TABLET | Freq: Three times a day (TID) | ORAL | Status: AC | PRN

## 2016-02-06 MED ORDER — PROCHLORPERAZINE 25 MG RE SUPP: 25.0000 mg | Freq: Three times a day (TID) | RECTAL | Status: DC | PRN

## 2016-02-06 MED ORDER — GLYCOPYRROLATE 1 MG PO TABS: 1.0000 mg | ORAL_TABLET | Freq: Three times a day (TID) | ORAL | Status: DC | PRN

## 2016-02-06 MED ORDER — MORPHINE SULFATE (CONCENTRATE) 10 MG/0.5ML PO SOLN: 5.0000 mg | Freq: Three times a day (TID) | ORAL | Status: AC

## 2016-02-06 MED ORDER — MORPHINE SULFATE (CONCENTRATE) 10 MG/0.5ML PO SOLN: 5.0000 mg | ORAL | Status: AC | PRN

## 2016-02-06 MED ORDER — LORAZEPAM 0.5 MG PO TABS: 0.5000 mg | ORAL_TABLET | ORAL | Status: DC | PRN

## 2016-02-06 NOTE — Care Management (Signed)
This patient is from [facility]. CSW will follow. RNCM will follow for home health PT needs.

## 2016-02-06 NOTE — Progress Notes (Signed)
Nutrition Follow-up  DOCUMENTATION CODES:   Severe malnutrition in context of chronic illness  INTERVENTION:   Meals and Snacks: Cater to patient preferences; pt a feeder. Provide much encouragement at meal times. Medical Food Supplement Therapy: Continue Pensions consultant Cup as ordered Coordination of Care: will follow poc s/p palliative care evaluation.    NUTRITION DIAGNOSIS:   Malnutrition related to chronic illness as evidenced by severe depletion of muscle mass, severe depletion of body fat.  GOAL:   Patient will meet greater than or equal to 90% of their needs; ongoing  MONITOR:    (Energy Intake, Electrolyte and renal Profile, Skin, Anthropometrics, DigestiveSystem)  REASON FOR ASSESSMENT:   Consult Assessment of nutrition requirement/status  ASSESSMENT:   Pt admitted with poor po intake, lethargy with UTI, hypernatremia and dislocated right shoulder. Per MD note pt nonverbal at baseline. Pt on isolation for MRSA.  Pt continues to refuse all po medications/food. Pt resting on visit this am.   Diet Order:  DIET - DYS 1 Room service appropriate?: No; Fluid consistency:: Nectar Thick   Current Nutrition: Pt refusing all po intake currently. RD did note per MD Hower pt had sips of orange juice this am.    Gastrointestinal Profile: Last BM: 01/31/2016   Scheduled Medications:  . aspirin EC  81 mg Oral Daily  . atorvastatin  20 mg Oral QHS  . Chlorhexidine Gluconate Cloth  6 each Topical Q0600  . docusate sodium  100 mg Oral BID  . donepezil  10 mg Oral QHS  . enoxaparin (LOVENOX) injection  30 mg Subcutaneous Q24H  . loratadine  10 mg Oral Daily  . mupirocin ointment  1 application Nasal BID  . polyethylene glycol  17 g Oral Daily  . potassium chloride  40 mEq Oral Once  . sodium chloride flush  3 mL Intravenous Q12H    Continuous Medications:  . sodium chloride 100 mL/hr at 02/06/16 1137     Electrolyte/Renal Profile and Glucose Profile:    Recent Labs Lab 02/03/16 1202 02/04/16 0536 02/05/16 0454  NA 150* 147* 144  K 4.0 3.4* 3.0*  CL 111 116* 116*  CO2 BUN CREATININE 0.79 0.63 0.64  CALCIUM 9.8 7.7* 7.6*  MG  --   --  1.9  GLUCOSE 131* 97 80   Protein Profile:  Recent Labs Lab 02/03/16 1202 02/06/16 0628  ALBUMIN 3.6 2.3*    Weight Trend since Admission: Filed Weights   02/04/16 0428 02/05/16 0452 02/06/16 0500  Weight: 95 lb 12.8 oz (43.455 kg) 97 lb 1.6 oz (44.044 kg) 96 lb 1.6 oz (43.591 kg)    BMI:  Body mass index is 18.77 kg/(m^2).  Estimated Nutritional Needs:   Kcal:  BEE: 811kcals, TEE: (IF 1.2-1.4)(AF 1.2) 1168-1363kcals  Protein:  48-57g protein (1.01-1.3g/kg)  Fluid:  1100-139mL of fluid (25-30mL/kg)  EDUCATION NEEDS:   No education needs identified at this time  HIGH Care Level  Leda Quail, RD, LDN Pager [phone] Weekend/On-Call Pager [phone]

## 2016-02-06 NOTE — Plan of Care (Signed)
Problem: Physical Regulation: Goal: Quality of life will improve Outcome: Progressing Comfort Care. RN May Pronounce. Plan to discharge to [facility] 02/07/2016.

## 2016-02-06 NOTE — Progress Notes (Signed)
[facility] - [place] at [facility]   PATIENT NAME: Jeanette Juarez    MR#:  [id number]  DATE OF BIRTH:  Jul 30, 1929  SUBJECTIVE:  Unable to provide meaningful information given mental status medical condition Case discussed with palliative care last evening  REVIEW OF SYSTEMS:   Unable to provide meaningful information given mental status medical condition DRUG ALLERGIES:  No Known Allergies  VITALS:  Blood pressure 119/53, pulse 67, temperature 98.4 F (36.9 C), temperature source Axillary, resp. rate 18, height 5' (1.524 m), weight 43.591 kg (96 lb 1.6 oz), SpO2 97 %.  PHYSICAL EXAMINATION:   VITAL SIGNS: Filed Vitals:   02/06/16 0447 02/06/16 0937  BP: 140/80 119/53  Pulse: 70 67  Temp: 98 F (36.7 C) 98.4 F (36.9 C)  Resp: 20 18   GENERAL:82 y.o.female moderate distress given mental status.  HEAD: Normocephalic, atraumatic.  EYES: Pupils equal, round, reactive to light. Unable to assess extraocular muscles given mental status/medical condition. No scleral icterus.  MOUTH: poor mucosal membrane. Dentition poor No abscess noted.  EAR, NOSE, THROAT: Clear without exudates. No external lesions.  NECK: Supple. No thyromegaly. No nodules. No JVD.  PULMONARY: Clear to ascultation, without wheeze rails or rhonci. No use of accessory muscles, Good respiratory effort. good air entry bilaterally CHEST: Nontender to palpation.  CARDIOVASCULAR: S1 and S2. Regular rate and rhythm. No murmurs, rubs, or gallops. No edema. Pedal pulses 2+ bilaterally.  GASTROINTESTINAL: Soft, nontender, nondistended. No masses. Positive bowel sounds. No hepatosplenomegaly.  MUSCULOSKELETAL: No swelling, clubbing, or edema. Range of motion full in all extremities.  NEUROLOGIC: Unable to assess given mental status/medical condition SKIN: No ulceration, lesions, rashes, or cyanosis. Skin warm and dry. Turgor intact.  PSYCHIATRIC: Unable to assess given mental status/medical  condition        LABORATORY PANEL:   CBC  Recent Labs Lab 02/04/16 0536  WBC 9.7  HGB 12.9  HCT 38.2  PLT 183   ------------------------------------------------------------------------------------------------------------------  Chemistries   Recent Labs Lab 02/03/16 1202  02/05/16 0454  NA 150*  < > 144  K 4.0  < > 3.0*  CL 111  < > 116*  CO2 31  < > 23  GLUCOSE 131*  < > 80  BUN 20  < > 17  CREATININE 0.79  < > 0.64  CALCIUM 9.8  < > 7.6*  MG  --   --  1.9  AST 85*  --   --   ALT 42  --   --   ALKPHOS 83  --   --   BILITOT 1.1  --   --   < > = values in this interval not displayed. ------------------------------------------------------------------------------------------------------------------  Cardiac Enzymes  Recent Labs Lab 02/04/16 0144  TROPONINI 0.05*   ------------------------------------------------------------------------------------------------------------------  RADIOLOGY:  No results found.  EKG:   Orders placed or performed during the hospital encounter of 02/03/16  . ED EKG  . ED EKG  . EKG 12-Lead  . EKG 12-Lead    ASSESSMENT AND PLAN:   82 year old Caucasian female admitted 02/03/16 from [place] with altered mental status. Found to have shoulder dislocation as well  1. Acute on chronic metabolic encephalopathy secondary to urinary tract infection site unspecified: actually close to baseline, ceftriaxone day 3/3-completed  2. Hypernatremia secondary to poor by mouth intake: Resolved with IV fluid hydration  3.Dislocated right shoulder. Status post reduction  4. Mild elevation troponin: Stabilized 5. Essential hypertension: Norvasc currently held restart blood pressure elevates 6. Hyperlipidemia  unspecified statin therapy 7. Dementia: Aricept 8. DVT prophylaxis with Lovenox   disposition: Palliative care consult question hospice versus retransfer skilled    All the records are reviewed and case discussed with Care  Management/Social Workerr. Management plans discussed with the patient, family and they are in agreement.  CODE STATUS: DO NOT RESUSCITATE  TOTAL TIME TAKING CARE OF THIS PATIENT: 28 minutes.   POSSIBLE D/C IN 2-3 DAYS, DEPENDING ON CLINICAL CONDITION.   Duston Smolenski,  Mardi Mainland.D on 02/06/2016 at 12:18 PM  Between 7am to 6pm - Pager - [phone]  After 6pm: House Pager: - [phone]  [facility]  Office  [phone]  CC: Primary care physician; No PCP Per Patient

## 2016-02-06 NOTE — Clinical Social Work Note (Signed)
Pt will discharge to [facility] tomorrow. [facility] DSS Guardian, [name], is aware. [name] will be notifying [name]. CSW will continue to follow to facilitate discharge on Saturday, 02/07/2016.   Dede Query, MSW, LCSW Clinical Social Worker  [phone]

## 2016-02-06 NOTE — Plan of Care (Signed)
Problem: Education: Goal: Knowledge of [facility] Education information/materials will improve Outcome: Not Met (add Reason) confused  Problem: Nutrition: Goal: Adequate nutrition will be maintained Outcome: Not Progressing Refuses to eat

## 2016-02-06 NOTE — Discharge Summary (Signed)
[facility] - [place] at [facility]   PATIENT NAME: Jeanette Juarez    MR#:  [id number]  DATE OF BIRTH:  February 11, 1929  DATE OF ADMISSION:  02/03/2016 ADMITTING PHYSICIAN: Milagros Loll, MD  DATE OF DISCHARGE: No discharge date for patient encounter.  PRIMARY CARE PHYSICIAN: No PCP Per Patient    ADMISSION DIAGNOSIS:  Dehydration [E86.0] Hypernatremia [E87.0] Altered mental status [R41.82] Bruising [T14.8] UTI (lower urinary tract infection) [N39.0] Shoulder dislocation, right, initial encounter [S43.004A] Altered mental status, unspecified altered mental status type [R41.82]  DISCHARGE DIAGNOSIS:  Active Problems:   UTI (lower urinary tract infection)   Protein-calorie malnutrition, severe  right shoulder dislocation Acute on chronic metabolic encephalopathy secondary to urinary tract infection Hypernatremia resolved  SECONDARY DIAGNOSIS:   Past Medical History  Diagnosis Date  . Hypertension   . Anxiety   . Severe major depression with psychotic features (HCC)   . Dementia   . Hyperlipidemia   . Edema   . Rosacea   . Polydipsia   . OA (ocular albinism) [facility])     HOSPITAL COURSE:  Jeanette Juarez  is a 82 y.o. female admitted 02/03/2016 with chief complaint altered mental status. At baseline she is nonverbal with dementia. Please see H&P performed by Dr. Elpidio Anis for further information. On admission she was noted to have evidence of urinary tract infection as well as have hypernatremia secondary to poor oral intake. She was placed on antibiotics and finished a course of ceftriaxone for her urinary tract infection. Placed on IV fluid hydration for her elevated sodium level. Both of these elements did resolve. Patient returned to baseline which is not markedly different from her admission presentation. Case was discussed with DSS who had been involved with her care for some time. All parties involved felt that hospice was likely the most appropriate  placement. Given the patient meets criteria for malnutrition as well as end-stage dementia. Given her refusal of oral intake  DISCHARGE CONDITIONS:   Fair/hospice  CONSULTS OBTAINED:     DRUG ALLERGIES:  No Known Allergies  DISCHARGE MEDICATIONS:   Current Discharge Medication List    START taking these medications   Details  acetaminophen (TYLENOL) 650 MG suppository Place 1 suppository (650 mg total) rectally every 4 (four) hours as needed for mild pain or fever. Qty: 6 suppository, Refills: 0    bisacodyl (DULCOLAX) 10 MG suppository Place 1 suppository (10 mg total) rectally daily as needed for moderate constipation. Qty: 6 suppository, Refills: 0    glycopyrrolate (ROBINUL) 1 MG tablet Take 1 tablet (1 mg total) by mouth 3 (three) times daily as needed (secretions). Qty: 10 tablet, Refills: 0    LORazepam (ATIVAN) 0.5 MG tablet Take 1 tablet (0.5 mg total) by mouth every 4 (four) hours as needed for anxiety. Qty: 15 tablet, Refills: 0    !! Morphine Sulfate (MORPHINE CONCENTRATE) 10 MG/0.5ML SOLN concentrated solution Take 0.25 mLs (5 mg total) by mouth every 2 (two) hours as needed for moderate pain, severe pain or shortness of breath. Qty: 30 mL, Refills: 0    !! Morphine Sulfate (MORPHINE CONCENTRATE) 10 MG/0.5ML SOLN concentrated solution Take 0.25 mLs (5 mg total) by mouth every 8 (eight) hours. Qty: 30 mL, Refills: 0    prochlorperazine (COMPAZINE) 25 MG suppository Place 1 suppository (25 mg total) rectally every 8 (eight) hours as needed for nausea or vomiting. Qty: 6 suppository, Refills: 0     !! - Potential duplicate medications found. Please discuss with provider.  CONTINUE these medications which have CHANGED   Details  White Petrolatum-Mineral Oil (ARTIFICIAL TEARS) OINT ophthalmic ointment Place 1 application into both eyes every 4 (four) hours as needed. Prn dry eyes and to keep everted eyelid moist Qty: 3.5 g, Refills: 0      STOP taking these  medications     acetaminophen (TYLENOL) 325 MG tablet      acetaminophen (TYLENOL) 500 MG tablet      alendronate (FOSAMAX) 70 MG tablet      amLODipine (NORVASC) 10 MG tablet      aspirin EC 81 MG tablet      atorvastatin (LIPITOR) 20 MG tablet      Calcium Citrate-Vitamin D (CITRACAL PETITES/VITAMIN D) 200-250 MG-UNIT TABS      donepezil (ARICEPT) 10 MG tablet      loratadine (CLARITIN) 10 MG tablet      Multiple Vitamins-Minerals (THERATRUM COMPLETE PO)      polyethylene glycol (MIRALAX / GLYCOLAX) packet          DISCHARGE INSTRUCTIONS:    DIET:  As tolerated  DISCHARGE CONDITION:  Fair/discharged to hospice  ACTIVITY:  Activity as tolerated  OXYGEN:  Home Oxygen: No.   Oxygen Delivery: room air  DISCHARGE LOCATION:  Hospice   If you experience worsening of your admission symptoms, develop shortness of breath, life threatening emergency, suicidal or homicidal thoughts you must seek medical attention immediately by calling 911 or calling your MD immediately  if symptoms less severe.  You Must read complete instructions/literature along with all the possible adverse reactions/side effects for all the Medicines you take and that have been prescribed to you. Take any new Medicines after you have completely understood and accpet all the possible adverse reactions/side effects.   Please note  You were cared for by a hospitalist during your hospital stay. If you have any questions about your discharge medications or the care you received while you were in the hospital after you are discharged, you can call the unit and asked to speak with the hospitalist on call if the hospitalist that took care of you is not available. Once you are discharged, your primary care physician will handle any further medical issues. Please note that NO REFILLS for any discharge medications will be authorized once you are discharged, as it is imperative that you return to your primary care  physician (or establish a relationship with a primary care physician if you do not have one) for your aftercare needs so that they can reassess your need for medications and monitor your lab values.    On the day of Discharge:   VITAL SIGNS:  Blood pressure 119/53, pulse 67, temperature 98.4 F (36.9 C), temperature source Axillary, resp. rate 18, height 5' (1.524 m), weight 43.591 kg (96 lb 1.6 oz), SpO2 97 %.  I/O:   Intake/Output Summary (Last 24 hours) at 02/06/16 1532 Last data filed at 02/05/16 1758  Gross per 24 hour  Intake 1088.33 ml  Output      0 ml  Net 1088.33 ml    PHYSICAL EXAMINATION:  GENERAL:82 y.o.female moderate distress given mental status.  HEAD: Normocephalic, atraumatic.  EYES: Pupils equal, round, reactive to light. Unable to assess extraocular muscles given mental status/medical condition. No scleral icterus.  MOUTH: poor mucosal membrane. Dentition poor No abscess noted.  EAR, NOSE, THROAT: Clear without exudates. No external lesions.  NECK: Supple. No thyromegaly. No nodules. No JVD.  PULMONARY: Clear to ascultation, without wheeze rails or rhonci.  No use of accessory muscles, Good respiratory effort. good air entry bilaterally CHEST: Nontender to palpation.  CARDIOVASCULAR: S1 and S2. Regular rate and rhythm. No murmurs, rubs, or gallops. No edema. Pedal pulses 2+ bilaterally.  GASTROINTESTINAL: Soft, nontender, nondistended. No masses. Positive bowel sounds. No hepatosplenomegaly.  MUSCULOSKELETAL: No swelling, clubbing, or edema. Range of motion full in all extremities.  NEUROLOGIC: Unable to assess given mental status/medical condition SKIN: No ulceration, lesions, rashes, or cyanosis. Skin warm and dry. Turgor intact.  PSYCHIATRIC: Unable to assess given mental status/medical condition  DATA REVIEW:   CBC  Recent Labs Lab 02/04/16 0536  WBC 9.7  HGB 12.9  HCT 38.2  PLT 183    Chemistries   Recent Labs Lab 02/03/16 1202   02/05/16 0454  NA 150*  < > 144  K 4.0  < > 3.0*  CL 111  < > 116*  CO2 31  < > 23  GLUCOSE 131*  < > 80  BUN 20  < > 17  CREATININE 0.79  < > 0.64  CALCIUM 9.8  < > 7.6*  MG  --   --  1.9  AST 85*  --   --   ALT 42  --   --   ALKPHOS 83  --   --   BILITOT 1.1  --   --   < > = values in this interval not displayed.  Cardiac Enzymes  Recent Labs Lab 02/04/16 0144  TROPONINI 0.05*    Microbiology Results  Results for orders placed or performed during the hospital encounter of 02/03/16  Urine culture     Status: None (Preliminary result)   Collection Time: 02/03/16 12:02 PM  Result Value Ref Range Status   Specimen Description URINE, RANDOM  Final   Special Requests NONE  Final   Culture HOLDING FOR POSSIBLE PATHOGEN  Final   Report Status PENDING  Incomplete  MRSA PCR Screening     Status: Abnormal   Collection Time: 02/03/16  7:42 PM  Result Value Ref Range Status   MRSA by PCR POSITIVE (A) NEGATIVE Final    Comment:        The GeneXpert MRSA Assay (FDA approved for NASAL specimens only), is one component of a comprehensive MRSA colonization surveillance program. It is not intended to diagnose MRSA infection nor to guide or monitor treatment for MRSA infections. CRITICAL RESULT CALLED TO, READ BACK BY AND VERIFIED WITH: [facility] STONE AT 2155 02/03/16 MLZ      RADIOLOGY:  No results found.   Management plans discussed with the patient, family and they are in agreement.  CODE STATUS:     Code Status Orders        Start     Ordered   02/03/16 1855  Do not attempt resuscitation (DNR)   Continuous    Question Answer Comment  In the event of cardiac or respiratory ARREST Do not call a "code blue"   In the event of cardiac or respiratory ARREST Do not perform Intubation, CPR, defibrillation or ACLS   In the event of cardiac or respiratory ARREST Use medication by any route, position, wound care, and other measures to relive pain and suffering. May use  oxygen, suction and manual treatment of airway obstruction as needed for comfort.      02/03/16 1854    Code Status History    Date Active Date Inactive Code Status Order ID Comments User Context   02/03/2016  3:37 PM 02/03/2016  6:54 PM Full Code [id number]  Milagros Loll, MD ED    Advance Directive Documentation        Most Recent Value   Type of Advance Directive  Out of facility DNR (pink MOST or yellow form)   Pre-existing out of facility DNR order (yellow form or pink MOST form)     "MOST" Form in Place?        TOTAL TIME TAKING CARE OF THIS PATIENT: 28 minutes.    Hower,  Mardi Mainland.D on 02/06/2016 at 3:32 PM  Between 7am to 6pm - Pager - [phone]  After 6pm go to [email or web address] - password [email or web address] [facility]  [facility]  Office  [phone]  CC: Primary care physician; No PCP Per Patient

## 2016-02-06 NOTE — Progress Notes (Signed)
New referral for hospice home on 82 yo female who was admitted to [facility] on 2.14.17 with altered mental status.  Patient is post palliative medicine consult and DSS guardian [name] made the decision to transfer her at discharge to the hospice home for EOL care and management of pain from shoulder dislocation.  Patient has past medical history of hypertension, anxiety, dementia, hyperlipidemia, edema and OA.  Patient is non-verbal and is not eating or drinking. Discussed with [name] SW at DSS and we will plan to transition the patient tomorrow to the hospice home.  She is in agreement with this.  Consents faxed to DSS to be signed.  Updated information faxed to referral intake.  Thank you for allowing participation in this patient's care.

## 2016-02-07 LAB — URINE CULTURE: Culture: >=100000

## 2016-02-07 NOTE — Discharge Summary (Signed)
[facility] - [place] at [facility]   PATIENT NAME: Jeanette Jeanette    MR#:  [id number]  DATE OF BIRTH:  02/20/1929  DATE OF ADMISSION:  02/03/2016 ADMITTING PHYSICIAN: Milagros Loll, MD  DATE OF DISCHARGE: 02/07/2016  PRIMARY CARE PHYSICIAN: No PCP Per Patient    ADMISSION DIAGNOSIS:  Dehydration [E86.0] Hypernatremia [E87.0] Altered mental status [R41.82] Bruising [T14.8] UTI (lower urinary tract infection) [N39.0] Shoulder dislocation, right, initial encounter [S43.004A] Altered mental status, unspecified altered mental status type [R41.82]  DISCHARGE DIAGNOSIS:  Active Problems:   UTI (lower urinary tract infection)   Protein-calorie malnutrition, severe   Bruising  right shoulder dislocation Acute on chronic metabolic encephalopathy secondary to urinary tract infection Hypernatremia resolved  SECONDARY DIAGNOSIS:   Past Medical History  Diagnosis Date  . Hypertension   . Anxiety   . Severe major depression with psychotic features (HCC)   . Dementia   . Hyperlipidemia   . Edema   . Rosacea   . Polydipsia   . OA (ocular albinism) [facility])     HOSPITAL COURSE:  [name]  is a 82 y.o. female admitted 02/03/2016 with chief complaint altered mental status. At baseline she is nonverbal with dementia. Please see H&P performed by Dr. Elpidio Anis for further information.  On admission she was noted to have evidence of urinary tract infection as well as have hypernatremia secondary to poor oral intake.  She was diagnosed with acute on chronic metabolic encephalopathy from E.[facility] UTI. She was treathed for UTI and hypernatremia with antibiotics and IV fluid hBoth of these elements did resolve. Patient returned to baseline which is not markedly different from her admission presentation. Case was discussed with DSS who had been involved with her care for some time. All parties involved felt that hospice was likely the most appropriate placement. Given the patient  meets criteria for malnutrition as well as end-stage dementia. Given her refusal of oral intake  DISCHARGE CONDITIONS:   Fair/hospice  CONSULTS OBTAINED:     DRUG ALLERGIES:  No Known Allergies  DISCHARGE MEDICATIONS:   Current Discharge Medication List    START taking these medications   Details  acetaminophen (TYLENOL) 650 MG suppository Place 1 suppository (650 mg total) rectally every 4 (four) hours as needed for mild pain or fever. Qty: 6 suppository, Refills: 0    bisacodyl (DULCOLAX) 10 MG suppository Place 1 suppository (10 mg total) rectally daily as needed for moderate constipation. Qty: 6 suppository, Refills: 0    glycopyrrolate (ROBINUL) 1 MG tablet Take 1 tablet (1 mg total) by mouth 3 (three) times daily as needed (secretions). Qty: 10 tablet, Refills: 0    LORazepam (ATIVAN) 0.5 MG tablet Take 1 tablet (0.5 mg total) by mouth every 4 (four) hours as needed for anxiety. Qty: 15 tablet, Refills: 0    !! Morphine Sulfate (MORPHINE CONCENTRATE) 10 MG/0.5ML SOLN concentrated solution Take 0.25 mLs (5 mg total) by mouth every 2 (two) hours as needed for moderate pain, severe pain or shortness of breath. Qty: 30 mL, Refills: 0    !! Morphine Sulfate (MORPHINE CONCENTRATE) 10 MG/0.5ML SOLN concentrated solution Take 0.25 mLs (5 mg total) by mouth every 8 (eight) hours. Qty: 30 mL, Refills: 0    prochlorperazine (COMPAZINE) 25 MG suppository Place 1 suppository (25 mg total) rectally every 8 (eight) hours as needed for nausea or vomiting. Qty: 6 suppository, Refills: 0     !! - Potential duplicate medications found. Please discuss with provider.  CONTINUE these medications which have CHANGED   Details  White Petrolatum-Mineral Oil (ARTIFICIAL TEARS) OINT ophthalmic ointment Place 1 application into both eyes every 4 (four) hours as needed. Prn dry eyes and to keep everted eyelid moist Qty: 3.5 g, Refills: 0      STOP taking these medications     acetaminophen  (TYLENOL) 325 MG tablet      acetaminophen (TYLENOL) 500 MG tablet      alendronate (FOSAMAX) 70 MG tablet      amLODipine (NORVASC) 10 MG tablet      aspirin EC 81 MG tablet      atorvastatin (LIPITOR) 20 MG tablet      Calcium Citrate-Vitamin D (CITRACAL PETITES/VITAMIN D) 200-250 MG-UNIT TABS      donepezil (ARICEPT) 10 MG tablet      loratadine (CLARITIN) 10 MG tablet      Multiple Vitamins-Minerals (THERATRUM COMPLETE PO)      polyethylene glycol (MIRALAX / GLYCOLAX) packet          DISCHARGE INSTRUCTIONS:    DIET:  As tolerated  DISCHARGE CONDITION:  Fair/discharged to hospice  ACTIVITY:  Activity as tolerated  OXYGEN:  Home Oxygen: No.   Oxygen Delivery: room air  DISCHARGE LOCATION:  Hospice   If you experience worsening of your admission symptoms, develop shortness of breath, life threatening emergency, suicidal or homicidal thoughts you must seek medical attention immediately by calling 911 or calling your MD immediately  if symptoms less severe.  You Must read complete instructions/literature along with all the possible adverse reactions/side effects for all the Medicines you take and that have been prescribed to you. Take any new Medicines after you have completely understood and accpet all the possible adverse reactions/side effects.   Please note  You were cared for by a hospitalist during your hospital stay. If you have any questions about your discharge medications or the care you received while you were in the hospital after you are discharged, you can call the unit and asked to speak with the hospitalist on call if the hospitalist that took care of you is not available. Once you are discharged, your primary care physician will handle any further medical issues. Please note that NO REFILLS for any discharge medications will be authorized once you are discharged, as it is imperative that you return to your primary care physician (or establish a  relationship with a primary care physician if you do not have one) for your aftercare needs so that they can reassess your need for medications and monitor your lab values.    On the day of Discharge:   VITAL SIGNS:  Blood pressure 136/57, pulse 70, temperature 98.2 F (36.8 C), temperature source Oral, resp. rate 20, height 5' (1.524 m), weight 43.591 kg (96 lb 1.6 oz), SpO2 98 %.  I/O:    Intake/Output Summary (Last 24 hours) at 02/07/16 0814 Last data filed at 02/06/16 1600  Gross per 24 hour  Intake    550 ml  Output      0 ml  Net    550 ml    PHYSICAL EXAMINATION:  GENERAL:82 y.o.female moderate distress given mental status.  HEAD: Normocephalic, atraumatic.  EYES: Pupils equal, round, reactive to light. Unable to assess extraocular muscles given mental status/medical condition. No scleral icterus.  MOUTH: poor mucosal membrane. Dentition poor No abscess noted.  EAR, NOSE, THROAT: Clear without exudates. No external lesions.  NECK: Supple. No thyromegaly. No nodules. No JVD.  PULMONARY: Clear  to ascultation, without wheeze rails or rhonci. No use of accessory muscles, Good respiratory effort. good air entry bilaterally CHEST: Nontender to palpation.  CARDIOVASCULAR: S1 and S2. Regular rate and rhythm. No murmurs, rubs, or gallops. No edema. Pedal pulses 2+ bilaterally.  GASTROINTESTINAL: Soft, nontender, nondistended. No masses. Positive bowel sounds. No hepatosplenomegaly.  MUSCULOSKELETAL: No swelling, clubbing, or edema. Range of motion full in all extremities.  NEUROLOGIC: Unable to assess given mental status/medical condition SKIN: No ulceration, lesions, rashes, or cyanosis. Skin warm and dry. Turgor intact.  PSYCHIATRIC: Unable to assess given mental status/medical condition  DATA REVIEW:   CBC  Recent Labs Lab 02/04/16 0536  WBC 9.7  HGB 12.9  HCT 38.2  PLT 183    Chemistries   Recent Labs Lab 02/03/16 1202  02/05/16 0454  NA 150*  < >  144  K 4.0  < > 3.0*  CL 111  < > 116*  CO2 31  < > 23  GLUCOSE 131*  < > 80  BUN 20  < > 17  CREATININE 0.79  < > 0.64  CALCIUM 9.8  < > 7.6*  MG  --   --  1.9  AST 85*  --   --   ALT 42  --   --   ALKPHOS 83  --   --   BILITOT 1.1  --   --   < > = values in this interval not displayed.  Cardiac Enzymes  Recent Labs Lab 02/04/16 0144  TROPONINI 0.05*    Microbiology Results  Results for orders placed or performed during the hospital encounter of 02/03/16  Urine culture     Status: None   Collection Time: 02/03/16 12:02 PM  Result Value Ref Range Status   Specimen Description URINE, RANDOM  Final   Special Requests NONE  Final   Culture   Final    >=100,000 COLONIES/mL ESCHERICHIA COLI >=100,000 COLONIES/mL AEROCOCCUS URINAE    Report Status 02/07/2016 FINAL  Final   Organism ID, Bacteria ESCHERICHIA COLI  Final      Susceptibility   Escherichia coli - MIC*    AMPICILLIN >=32 RESISTANT Resistant     CEFAZOLIN <=4 SENSITIVE Sensitive     CEFTRIAXONE <=1 SENSITIVE Sensitive     CIPROFLOXACIN >=4 RESISTANT Resistant     GENTAMICIN <=1 SENSITIVE Sensitive     IMIPENEM <=0.25 SENSITIVE Sensitive     NITROFURANTOIN <=16 SENSITIVE Sensitive     TRIMETH/SULFA >=320 RESISTANT Resistant     AMPICILLIN/SULBACTAM >=32 RESISTANT Resistant     PIP/TAZO <=4 SENSITIVE Sensitive     Extended ESBL NEGATIVE Sensitive     * >=100,000 COLONIES/mL ESCHERICHIA COLI  MRSA PCR Screening     Status: Abnormal   Collection Time: 02/03/16  7:42 PM  Result Value Ref Range Status   MRSA by PCR POSITIVE (A) NEGATIVE Final    Comment:        The GeneXpert MRSA Assay (FDA approved for NASAL specimens only), is one component of a comprehensive MRSA colonization surveillance program. It is not intended to diagnose MRSA infection nor to guide or monitor treatment for MRSA infections. CRITICAL RESULT CALLED TO, READ BACK BY AND VERIFIED WITH: [facility] STONE AT 2155 02/03/16 MLZ       RADIOLOGY:  No results found.     CODE STATUS:     Code Status Orders        Start     Ordered   02/03/16 1855  Do  not attempt resuscitation (DNR)   Continuous    Question Answer Comment  In the event of cardiac or respiratory ARREST Do not call a "code blue"   In the event of cardiac or respiratory ARREST Do not perform Intubation, CPR, defibrillation or ACLS   In the event of cardiac or respiratory ARREST Use medication by any route, position, wound care, and other measures to relive pain and suffering. May use oxygen, suction and manual treatment of airway obstruction as needed for comfort.      02/03/16 1854    Code Status History    Date Active Date Inactive Code Status Order ID Comments User Context   02/03/2016  3:37 PM 02/03/2016  6:54 PM Full Code [id number]  Milagros Loll, MD ED    Advance Directive Documentation        Most Recent Value   Type of Advance Directive  Out of facility DNR (pink MOST or yellow form)   Pre-existing out of facility DNR order (yellow form or pink MOST form)     "MOST" Form in Place?        TOTAL TIME TAKING CARE OF THIS PATIENT:   Nari Vannatter M.D on 02/07/2016 at 8:14 AM  Between 7am to 6pm - Pager - [phone]  After 6pm go to [email or web address] - password [email or web address] [facility]  [facility]  Office  [phone]  CC: Primary care physician; No PCP Per Patient

## 2016-02-07 NOTE — Progress Notes (Signed)
MD making rounds. Discharge orders received for [facility]. License Clinical Child psychotherapist (LCSW) facilitating discharge to  [facility]. LCSW completed discharge packet and placed hard scripts, guardian information, and DNR form in packet. IV removed. Vital Signs Stable. No signs and/or symptoms of distress noted. Report called to Leotis Shames, RN at [facility]. No unanswered questions. EMS called for transport. Awaiting EMS.

## 2016-02-07 NOTE — Progress Notes (Signed)
Patient has orders for d/c to [facility]. Dr Adrian Saran completed and signed d/c summary and order. All d/c information faxed to [phone] and confirmed. SW verified with Leotis Shames that all documents were received. Lauren at [facility] provided SW with contact number and room number. Patient leaving by EMS. SW completed EMS packet and placed hard scripts, guradian information, and DNR form in packet. RN Dot Lanes (Ext [phone]) was notified that packet was ready and placed next to patients chart. Nurse was provided Rm # 1 and point of contact Lauren at facility, contact for report is [phone]. SW left patient guardian [name] from DSS a vm, informing guardian of d/c. SW also notified on call SW Gwinda Passe of d/c via phone. No further identified needs.   Sherryl Manges, BSW, MSW, LCSWA Clinical Social Work Dept [phone]

## 2016-02-07 NOTE — Plan of Care (Signed)
EMS on Unit for transport. Discharge Packet given to EMS transporter. Belongings sent with EMS.

## 2016-03-20 DEATH — deceased

## 2016-05-31 IMAGING — CR DG CHEST 1V
1 series · 1 of 1 positions shown · non-contrast
Comparison: 01/17/2012

CLINICAL DATA: Altered mental status.

EXAM:
CHEST 1 VIEW

[chest ap]
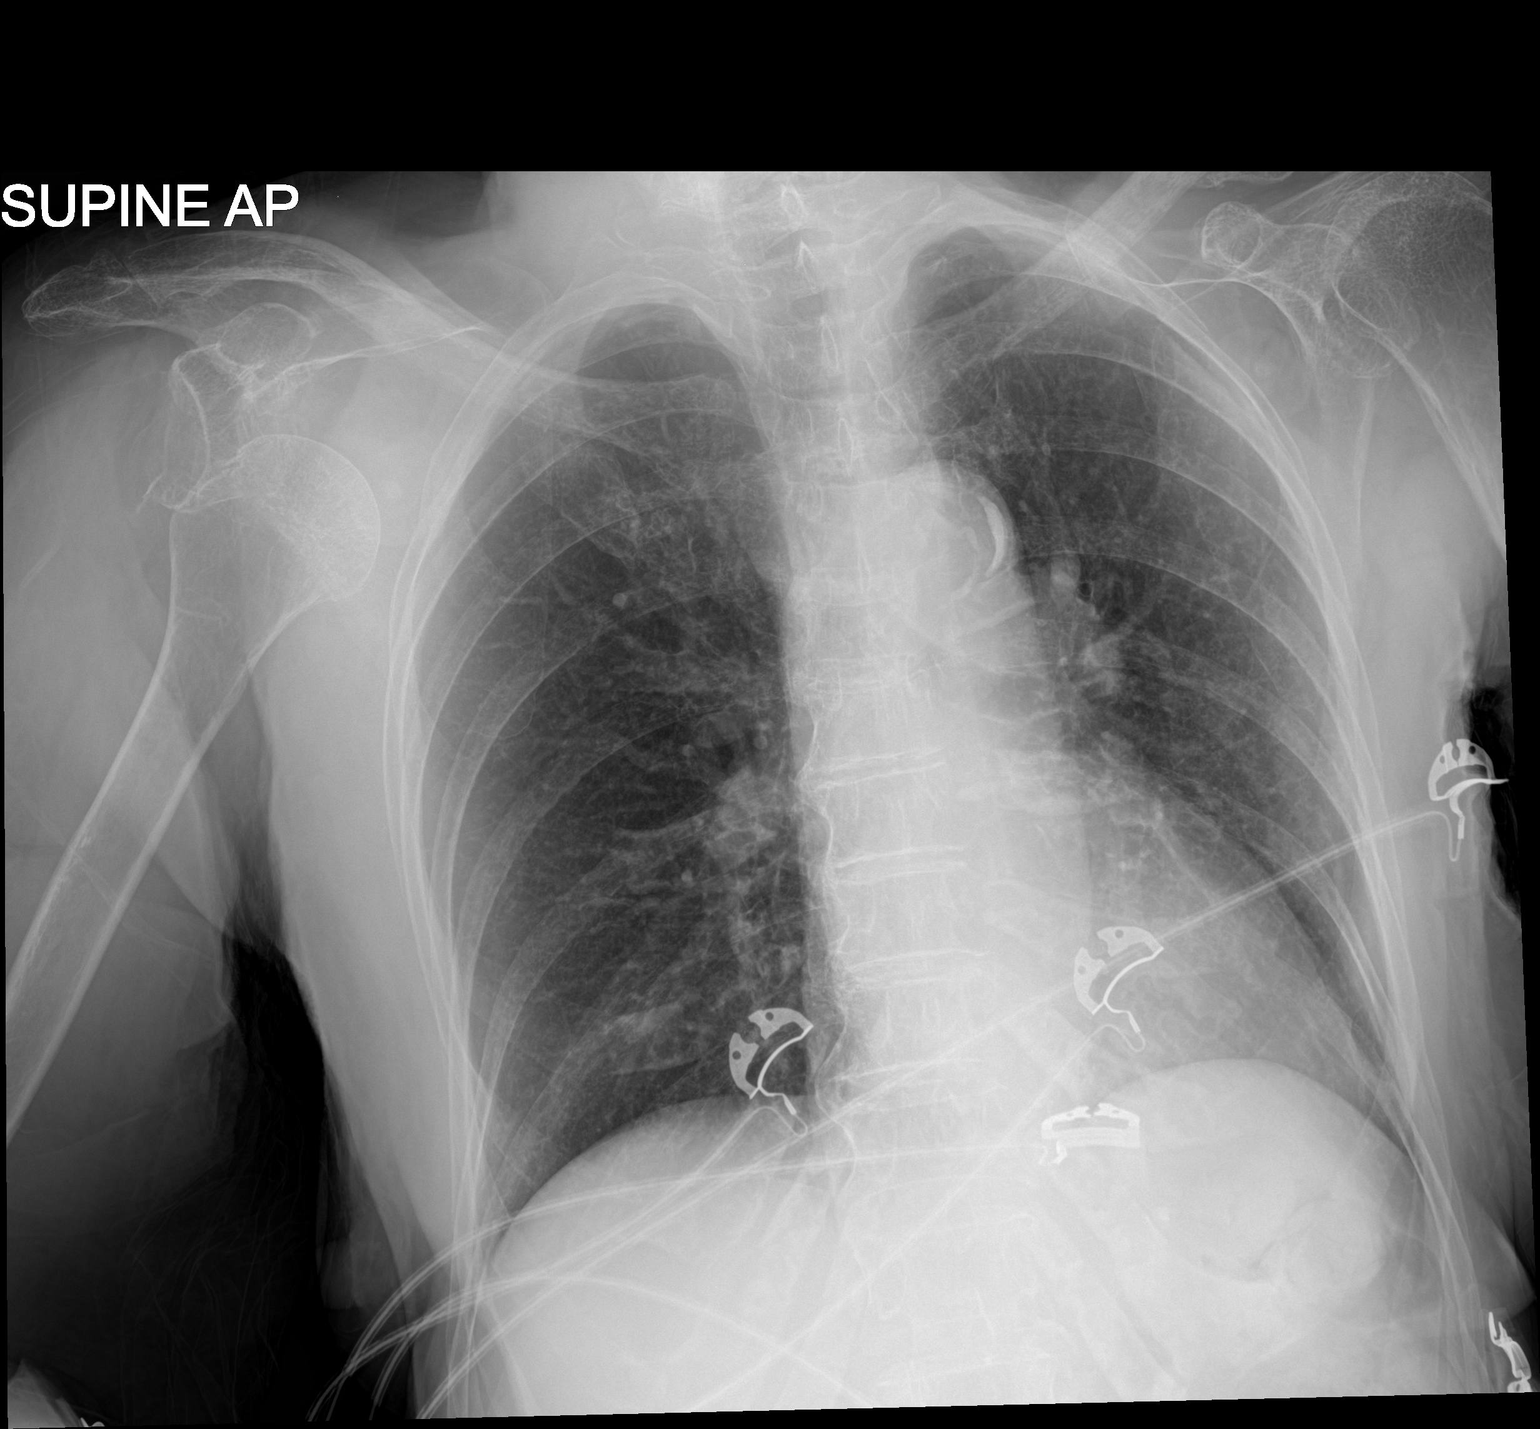

[1 of 1 positions shown; findings below may reference images not displayed]

FINDINGS: Hyperinflation of the lungs. Heart is upper limits normal in size.
Lungs are clear. No effusions.

There is anterior dislocation of the right humeral head, new since
[AGE] indeterminate.
IMPRESSION: No active cardiopulmonary disease.  Mild hyperinflation.

Anterior right shoulder dislocation.
